# Patient Record
Sex: Female | Born: 1965
Health system: Southern US, Community
[De-identification: ages and names within clinical notes are randomized; demographics above are authoritative.]

## PROBLEM LIST (undated history)

## (undated) DIAGNOSIS — T753XXA Motion sickness, initial encounter: Secondary | ICD-10-CM

## (undated) DIAGNOSIS — I471 Supraventricular tachycardia, unspecified: Secondary | ICD-10-CM

## (undated) DIAGNOSIS — N72 Inflammatory disease of cervix uteri: Secondary | ICD-10-CM

## (undated) DIAGNOSIS — I10 Essential (primary) hypertension: Secondary | ICD-10-CM

## (undated) DIAGNOSIS — M3500 Sicca syndrome, unspecified: Secondary | ICD-10-CM

## (undated) DIAGNOSIS — M5430 Sciatica, unspecified side: Secondary | ICD-10-CM

## (undated) DIAGNOSIS — M329 Systemic lupus erythematosus, unspecified: Secondary | ICD-10-CM

## (undated) DIAGNOSIS — IMO0002 Reserved for concepts with insufficient information to code with codable children: Secondary | ICD-10-CM

## (undated) DIAGNOSIS — R87629 Unspecified abnormal cytological findings in specimens from vagina: Secondary | ICD-10-CM

## (undated) DIAGNOSIS — M5136 Other intervertebral disc degeneration, lumbar region: Secondary | ICD-10-CM

## (undated) DIAGNOSIS — J069 Acute upper respiratory infection, unspecified: Secondary | ICD-10-CM

## (undated) DIAGNOSIS — L858 Other specified epidermal thickening: Secondary | ICD-10-CM

## (undated) DIAGNOSIS — R6882 Decreased libido: Secondary | ICD-10-CM

## (undated) DIAGNOSIS — D649 Anemia, unspecified: Secondary | ICD-10-CM

## (undated) DIAGNOSIS — I7300 Raynaud's syndrome without gangrene: Secondary | ICD-10-CM

## (undated) DIAGNOSIS — M51369 Other intervertebral disc degeneration, lumbar region without mention of lumbar back pain or lower extremity pain: Secondary | ICD-10-CM

## (undated) HISTORY — DX: Reserved for concepts with insufficient information to code with codable children: IMO0002

## (undated) HISTORY — DX: Other specified epidermal thickening: L85.8

## (undated) HISTORY — DX: Inflammatory disease of cervix uteri: N72

## (undated) HISTORY — DX: Essential (primary) hypertension: I10

## (undated) HISTORY — PX: TONSILLECTOMY: SUR1361

## (undated) HISTORY — PX: OTHER SURGICAL HISTORY: SHX169

## (undated) HISTORY — DX: Sjogren syndrome, unspecified: M35.00

## (undated) HISTORY — DX: Unspecified abnormal cytological findings in specimens from vagina: R87.629

## (undated) HISTORY — PX: LEEP: SHX91

## (undated) HISTORY — PX: BAND HEMORRHOIDECTOMY: SHX1213

## (undated) HISTORY — DX: Systemic lupus erythematosus, unspecified: M32.9

## (undated) HISTORY — DX: Decreased libido: R68.82

---

## 2004-08-16 ENCOUNTER — Ambulatory Visit: Payer: Self-pay

## 2004-11-10 ENCOUNTER — Ambulatory Visit: Payer: Self-pay

## 2005-11-24 ENCOUNTER — Ambulatory Visit: Payer: Self-pay

## 2006-11-03 ENCOUNTER — Ambulatory Visit: Payer: Self-pay | Admitting: Rheumatology

## 2006-11-20 ENCOUNTER — Encounter: Payer: Self-pay | Admitting: Orthopaedic Surgery

## 2006-11-28 ENCOUNTER — Ambulatory Visit: Payer: Self-pay

## 2007-12-05 ENCOUNTER — Ambulatory Visit: Payer: Self-pay

## 2008-05-26 ENCOUNTER — Ambulatory Visit: Payer: Self-pay | Admitting: Internal Medicine

## 2008-05-26 ENCOUNTER — Ambulatory Visit: Payer: Self-pay

## 2008-06-03 ENCOUNTER — Ambulatory Visit: Payer: Self-pay | Admitting: Internal Medicine

## 2008-07-06 ENCOUNTER — Emergency Department: Payer: Self-pay | Admitting: Emergency Medicine

## 2008-11-01 ENCOUNTER — Emergency Department: Payer: Self-pay | Admitting: Unknown Physician Specialty

## 2008-11-04 ENCOUNTER — Emergency Department: Payer: Self-pay | Admitting: Emergency Medicine

## 2008-11-09 ENCOUNTER — Emergency Department: Payer: Self-pay | Admitting: Internal Medicine

## 2008-11-15 ENCOUNTER — Emergency Department: Payer: Self-pay | Admitting: Emergency Medicine

## 2008-11-22 ENCOUNTER — Emergency Department: Payer: Self-pay | Admitting: Internal Medicine

## 2008-12-08 ENCOUNTER — Ambulatory Visit: Payer: Self-pay | Admitting: Obstetrics and Gynecology

## 2009-09-10 ENCOUNTER — Ambulatory Visit: Payer: Self-pay | Admitting: General Practice

## 2009-12-21 ENCOUNTER — Ambulatory Visit: Payer: Self-pay

## 2010-04-29 ENCOUNTER — Ambulatory Visit: Payer: Self-pay | Admitting: General Practice

## 2010-07-22 ENCOUNTER — Ambulatory Visit: Payer: Self-pay | Admitting: Otolaryngology

## 2011-01-17 ENCOUNTER — Ambulatory Visit: Payer: Self-pay

## 2011-01-25 ENCOUNTER — Ambulatory Visit: Payer: Self-pay | Admitting: Internal Medicine

## 2011-11-17 ENCOUNTER — Ambulatory Visit: Payer: Self-pay | Admitting: Otolaryngology

## 2011-11-17 LAB — CREATININE, SERUM
Creatinine: 0.97 mg/dL (ref 0.60–1.30)
EGFR (African American): >60
EGFR (Non-African Amer.): >60

## 2011-11-17 LAB — HCG, QUANTITATIVE, PREGNANCY: Beta Hcg, Quant.: <1 m[IU]/mL — ABNORMAL LOW

## 2012-01-18 ENCOUNTER — Ambulatory Visit: Payer: Self-pay

## 2012-02-12 ENCOUNTER — Ambulatory Visit: Payer: Self-pay | Admitting: Unknown Physician Specialty

## 2012-03-08 ENCOUNTER — Ambulatory Visit: Payer: Self-pay | Admitting: Unknown Physician Specialty

## 2012-03-21 ENCOUNTER — Ambulatory Visit: Payer: Self-pay | Admitting: Otolaryngology

## 2012-03-25 LAB — PATHOLOGY REPORT

## 2013-01-20 ENCOUNTER — Ambulatory Visit: Payer: Self-pay | Admitting: Obstetrics and Gynecology

## 2013-05-30 ENCOUNTER — Ambulatory Visit: Payer: Self-pay | Admitting: Internal Medicine

## 2013-06-15 ENCOUNTER — Ambulatory Visit: Payer: Self-pay | Admitting: Internal Medicine

## 2013-07-25 ENCOUNTER — Ambulatory Visit: Payer: Self-pay | Admitting: Internal Medicine

## 2013-08-13 ENCOUNTER — Ambulatory Visit: Payer: Self-pay | Admitting: Internal Medicine

## 2013-09-12 ENCOUNTER — Ambulatory Visit: Payer: Self-pay | Admitting: Internal Medicine

## 2013-10-13 ENCOUNTER — Ambulatory Visit: Payer: Self-pay | Admitting: Internal Medicine

## 2013-11-12 ENCOUNTER — Ambulatory Visit: Payer: Self-pay | Admitting: Internal Medicine

## 2013-12-13 ENCOUNTER — Ambulatory Visit: Payer: Self-pay | Admitting: Internal Medicine

## 2014-01-08 LAB — HM PAP SMEAR: HM Pap smear: NEGATIVE

## 2014-01-13 ENCOUNTER — Ambulatory Visit: Payer: Self-pay | Admitting: Internal Medicine

## 2014-02-17 ENCOUNTER — Ambulatory Visit: Payer: Self-pay | Admitting: Obstetrics and Gynecology

## 2014-04-16 DIAGNOSIS — D863 Sarcoidosis of skin: Secondary | ICD-10-CM | POA: Insufficient documentation

## 2014-04-16 DIAGNOSIS — M545 Low back pain, unspecified: Secondary | ICD-10-CM | POA: Insufficient documentation

## 2014-06-15 DIAGNOSIS — M5136 Other intervertebral disc degeneration, lumbar region: Secondary | ICD-10-CM | POA: Insufficient documentation

## 2014-06-15 DIAGNOSIS — M5416 Radiculopathy, lumbar region: Secondary | ICD-10-CM | POA: Insufficient documentation

## 2014-06-15 DIAGNOSIS — M51369 Other intervertebral disc degeneration, lumbar region without mention of lumbar back pain or lower extremity pain: Secondary | ICD-10-CM | POA: Insufficient documentation

## 2014-09-01 NOTE — Op Note (Signed)
PATIENT NAME:  Natalie Wheeler, Edia R MR#:  [id number] DATE OF BIRTH:  Mar 20, 1966  DATE OF PROCEDURE:  03/21/2012  PREOPERATIVE DIAGNOSIS: Cervical lymphadenopathy.   POSTOPERATIVE DIAGNOSIS: Cervical lymphadenopathy.  PROCEDURE: Deep left cervical lymph node biopsies.  SURGEON: Zackery BarefootJ. Madison Kendle Erker, MD   DESCRIPTION OF PROCEDURE: The patient was placed in the supine on the Operating Room table. After general LMA anesthesia was induced, the patient was turned 90 degrees clockwise from Anesthesia. The face was turned to the right and placed on a shoulder roll. The previously marked incision was locally anesthetized, prepped and draped in the usual fashion. A #15 blade was used to make an incision through the marking, and meticulous dissection was carried down to the posterior aspect of the sternocleidomastoid muscle. Care was taken to preserve the spinal accessory nerve which was positively stimulated and identified. The lymph nodes were deep to the spinal accessory nerve. These were carefully teased out. There were three 1.5 cm lymph nodes and a smaller lymph node in fibrofatty tissue. One of the larger nodes was sent for immediate analysis. The pathologist called back and reported that it was adequate tissue; therefore, the rest of the nodes were sent as well and the wound was closed over a perforated TLS drain. The wound was dressed with bacitracin. The patient was returned to Anesthesia, allowed to emerge from anesthesia in the Operating Room, and taken to the recovery room in stable condition. There were no complications. Estimated blood loss was 15 mL. ____________________________ J. Gertie BaronMadison Jerusalem Brownstein, MD [id number] D: 03/21/2012 20:16:04 ET T: 03/22/2012 09:54:31 ET JOB#: [id number]  cc: Zackery BarefootJ. Madison Kerby Borner, MD, <Dictator> Wendee CoppJMADISON Jamani Bearce MD ELECTRONICALLY SIGNED 04/17/2012 7:30

## 2015-01-21 ENCOUNTER — Other Ambulatory Visit: Payer: Self-pay | Admitting: Obstetrics and Gynecology

## 2015-02-10 ENCOUNTER — Encounter: Payer: Self-pay | Admitting: *Deleted

## 2015-02-18 ENCOUNTER — Ambulatory Visit (INDEPENDENT_AMBULATORY_CARE_PROVIDER_SITE_OTHER): Payer: 59 | Admitting: Obstetrics and Gynecology

## 2015-02-18 ENCOUNTER — Other Ambulatory Visit: Payer: Self-pay | Admitting: Obstetrics and Gynecology

## 2015-02-18 ENCOUNTER — Encounter: Payer: Self-pay | Admitting: Obstetrics and Gynecology

## 2015-02-18 VITALS — BP 130/77 | HR 85 | Ht 65.0 in | Wt 147.6 lb

## 2015-02-18 DIAGNOSIS — Z01419 Encounter for gynecological examination (general) (routine) without abnormal findings: Secondary | ICD-10-CM | POA: Diagnosis not present

## 2015-02-18 NOTE — Patient Instructions (Signed)
  Place annual gynecologic exam patient instructions here.  Thank you for enrolling in MyChart. Please follow the instructions below to securely access your online medical record. MyChart allows you to send messages to your doctor, view your test results, manage appointments, and more.   How Do I Sign Up? 1. In your Internet browser, go to [employer] and enter [email or web address]. 2. Click on the Sign Up Now link in the Sign In box. You will see the New Member Sign Up page. 3. Enter your MyChart Access Code exactly as it appears below. You will not need to use this code after you've completed the sign-up process. If you do not sign up before the expiration date, you must request a new code.  MyChart Access Code: [email or web address] Expires: 04/19/2015  3:53 PM  4. Enter your Social Security Number ([id number]) and Date of Birth (mm/dd/yyyy) as indicated and click Submit. You will be taken to the next sign-up page. 5. Create a MyChart ID. This will be your MyChart login ID and cannot be changed, so think of one that is secure and easy to remember. 6. Create a MyChart password. You can change your password at any time. 7. Enter your Password Reset Question and Answer. This can be used at a later time if you forget your password.  8. Enter your e-mail address. You will receive e-mail notification when new information is available in MyChart. 9. Click Sign Up. You can now view your medical record.   Additional Information Remember, MyChart is NOT to be used for urgent needs. For medical emergencies, dial 911.

## 2015-02-18 NOTE — Progress Notes (Signed)
  Subjective:     Natalie Wheeler is a 49 y.o. female and is here for a comprehensive physical exam. The patient reports no problems.  Social History   Social History  . Marital Status: Single    Spouse Name: N/A  . Number of Children: N/A  . Years of Education: N/A   Occupational History  . Not on file.   Social History Main Topics  . Smoking status: Former Games developer  . Smokeless tobacco: Never Used  . Alcohol Use: Yes  . Drug Use: No  . Sexual Activity: Yes   Other Topics Concern  . Not on file   Social History Narrative   Health Maintenance  Topic Date Due  . HIV Screening  02/27/1980  . TETANUS/TDAP  02/27/1984  . INFLUENZA VACCINE  12/14/2014  . PAP SMEAR  01/08/2017    The following portions of the patient's history were reviewed and updated as appropriate: allergies, current medications, past family history, past medical history, past social history, past surgical history and problem list.  Review of Systems A comprehensive review of systems was negative.   Objective:    General appearance: alert, cooperative, appears stated age, no distress and pale Neck: no adenopathy, no carotid bruit, no JVD, supple, symmetrical, trachea midline and thyroid not enlarged, symmetric, no tenderness/mass/nodules Lungs: clear to auscultation bilaterally Breasts: normal appearance, no masses or tenderness Heart: regular rate and rhythm, S1, S2 normal, no murmur, click, rub or gallop Abdomen: soft, non-tender; bowel sounds normal; no masses,  no organomegaly Pelvic: cervix normal in appearance, external genitalia normal, no adnexal masses or tenderness, no cervical motion tenderness, rectovaginal septum normal, uterus normal size, shape, and consistency and vagina normal without discharge Extremities: extremities normal, atraumatic, no cyanosis or edema    Assessment:    Healthy female exam. Lupus,ocp user      Plan:  Pap obtained; h/o abnormal pap;  MMG ordered RTC 1 year.    See After Visit Summary for Counseling Recommendations

## 2015-02-22 LAB — CYTOLOGY - PAP

## 2015-02-24 ENCOUNTER — Encounter: Payer: Self-pay | Admitting: Obstetrics and Gynecology

## 2015-04-01 ENCOUNTER — Ambulatory Visit: Payer: Self-pay

## 2015-04-13 ENCOUNTER — Ambulatory Visit
Admission: RE | Admit: 2015-04-13 | Discharge: 2015-04-13 | Disposition: A | Discharge status: Home / Self Care | Payer: 59 | Source: Ambulatory Visit | Attending: Obstetrics and Gynecology | Admitting: Obstetrics and Gynecology

## 2015-04-13 DIAGNOSIS — Z01419 Encounter for gynecological examination (general) (routine) without abnormal findings: Secondary | ICD-10-CM

## 2015-04-13 DIAGNOSIS — Z1231 Encounter for screening mammogram for malignant neoplasm of breast: Secondary | ICD-10-CM | POA: Insufficient documentation

## 2015-05-19 DIAGNOSIS — M329 Systemic lupus erythematosus, unspecified: Secondary | ICD-10-CM | POA: Diagnosis not present

## 2015-05-19 DIAGNOSIS — Z79899 Other long term (current) drug therapy: Secondary | ICD-10-CM | POA: Diagnosis not present

## 2015-05-21 DIAGNOSIS — R040 Epistaxis: Secondary | ICD-10-CM | POA: Diagnosis not present

## 2015-05-21 DIAGNOSIS — H6063 Unspecified chronic otitis externa, bilateral: Secondary | ICD-10-CM | POA: Diagnosis not present

## 2015-05-21 DIAGNOSIS — J301 Allergic rhinitis due to pollen: Secondary | ICD-10-CM | POA: Diagnosis not present

## 2015-06-15 DIAGNOSIS — R0981 Nasal congestion: Secondary | ICD-10-CM | POA: Diagnosis not present

## 2015-06-15 DIAGNOSIS — R040 Epistaxis: Secondary | ICD-10-CM | POA: Diagnosis not present

## 2015-06-15 DIAGNOSIS — J301 Allergic rhinitis due to pollen: Secondary | ICD-10-CM | POA: Diagnosis not present

## 2015-10-22 DIAGNOSIS — M329 Systemic lupus erythematosus, unspecified: Secondary | ICD-10-CM | POA: Diagnosis not present

## 2015-10-22 DIAGNOSIS — E538 Deficiency of other specified B group vitamins: Secondary | ICD-10-CM | POA: Diagnosis not present

## 2015-10-22 DIAGNOSIS — I10 Essential (primary) hypertension: Secondary | ICD-10-CM | POA: Diagnosis not present

## 2015-10-22 DIAGNOSIS — M3500 Sicca syndrome, unspecified: Secondary | ICD-10-CM | POA: Diagnosis not present

## 2015-10-22 DIAGNOSIS — M5416 Radiculopathy, lumbar region: Secondary | ICD-10-CM | POA: Diagnosis not present

## 2015-10-29 DIAGNOSIS — E538 Deficiency of other specified B group vitamins: Secondary | ICD-10-CM | POA: Diagnosis not present

## 2015-10-29 DIAGNOSIS — I10 Essential (primary) hypertension: Secondary | ICD-10-CM | POA: Diagnosis not present

## 2015-10-29 DIAGNOSIS — M329 Systemic lupus erythematosus, unspecified: Secondary | ICD-10-CM | POA: Diagnosis not present

## 2015-10-29 DIAGNOSIS — D6489 Other specified anemias: Secondary | ICD-10-CM | POA: Diagnosis not present

## 2015-11-08 DIAGNOSIS — M1990 Unspecified osteoarthritis, unspecified site: Secondary | ICD-10-CM | POA: Diagnosis not present

## 2015-11-08 DIAGNOSIS — M79642 Pain in left hand: Secondary | ICD-10-CM | POA: Diagnosis not present

## 2015-11-08 DIAGNOSIS — M1812 Unilateral primary osteoarthritis of first carpometacarpal joint, left hand: Secondary | ICD-10-CM | POA: Diagnosis not present

## 2015-11-08 DIAGNOSIS — M3500 Sicca syndrome, unspecified: Secondary | ICD-10-CM | POA: Diagnosis not present

## 2015-11-22 DIAGNOSIS — M329 Systemic lupus erythematosus, unspecified: Secondary | ICD-10-CM | POA: Diagnosis not present

## 2015-11-22 DIAGNOSIS — Z79899 Other long term (current) drug therapy: Secondary | ICD-10-CM | POA: Diagnosis not present

## 2015-11-26 DIAGNOSIS — M329 Systemic lupus erythematosus, unspecified: Secondary | ICD-10-CM | POA: Diagnosis not present

## 2015-11-26 DIAGNOSIS — Z79899 Other long term (current) drug therapy: Secondary | ICD-10-CM | POA: Diagnosis not present

## 2015-12-22 DIAGNOSIS — K1122 Acute recurrent sialoadenitis: Secondary | ICD-10-CM | POA: Diagnosis not present

## 2015-12-22 DIAGNOSIS — J0100 Acute maxillary sinusitis, unspecified: Secondary | ICD-10-CM | POA: Diagnosis not present

## 2015-12-30 ENCOUNTER — Other Ambulatory Visit: Payer: Self-pay | Admitting: Otolaryngology

## 2015-12-30 DIAGNOSIS — K1122 Acute recurrent sialoadenitis: Secondary | ICD-10-CM

## 2016-01-06 ENCOUNTER — Ambulatory Visit: Payer: 59

## 2016-01-12 ENCOUNTER — Ambulatory Visit: Payer: 59

## 2016-02-01 ENCOUNTER — Other Ambulatory Visit: Payer: Self-pay | Admitting: Obstetrics and Gynecology

## 2016-02-23 ENCOUNTER — Encounter: Payer: Self-pay | Admitting: Obstetrics and Gynecology

## 2016-02-23 ENCOUNTER — Ambulatory Visit (INDEPENDENT_AMBULATORY_CARE_PROVIDER_SITE_OTHER): Payer: 59 | Admitting: Obstetrics and Gynecology

## 2016-02-23 VITALS — BP 147/86 | HR 69 | Ht 65.0 in | Wt 147.4 lb

## 2016-02-23 DIAGNOSIS — I10 Essential (primary) hypertension: Secondary | ICD-10-CM | POA: Insufficient documentation

## 2016-02-23 DIAGNOSIS — M329 Systemic lupus erythematosus, unspecified: Secondary | ICD-10-CM | POA: Insufficient documentation

## 2016-02-23 DIAGNOSIS — M3500 Sicca syndrome, unspecified: Secondary | ICD-10-CM

## 2016-02-23 DIAGNOSIS — E538 Deficiency of other specified B group vitamins: Secondary | ICD-10-CM | POA: Insufficient documentation

## 2016-02-23 DIAGNOSIS — Z01419 Encounter for gynecological examination (general) (routine) without abnormal findings: Secondary | ICD-10-CM | POA: Diagnosis not present

## 2016-02-23 MED ORDER — ALPRAZOLAM 0.5 MG PO TABS: 0.5000 mg | ORAL_TABLET | Freq: Three times a day (TID) | ORAL | 2 refills | Status: DC | PRN

## 2016-02-23 NOTE — Progress Notes (Signed)
Subjective:   Natalie Wheeler is a 50 y.o. G0P0 Caucasian female here for a routine well-woman exam.  No LMP recorded. Patient is not currently having periods (Reason: Oral contraceptives).    Current complaints: anxiety and not happy with Buspar, lump under left anticubital- not painful, PCP: Marikay Alar       doesn't desire labs  Social History: Sexual: heterosexual Marital Status: divorced Living situation: alone Occupation: unknown occupation Tobacco/alcohol: no tobacco use Illicit drugs: no history of illicit drug use  The following portions of the patient's history were reviewed and updated as appropriate: allergies, current medications, past family history, past medical history, past social history, past surgical history and problem list.  Past Medical History Past Medical History:  Diagnosis Date  . Cervicitis   . Decreased libido   . Dyspareunia   . Hypertension   . Lupus   . Sjogren's disease (HCC)   . Vaginal Pap smear, abnormal     Past Surgical History Past Surgical History:  Procedure Laterality Date  . BAND HEMORRHOIDECTOMY    . LEEP    . TONSILLECTOMY      Gynecologic History G0P0  No LMP recorded. Patient is not currently having periods (Reason: Oral contraceptives). Contraception: abstinence and oral progesterone-only contraceptive Last Pap: 2016. Results were: normal Last mammogram: 2016. Results were: normal  Obstetric History OB History  Gravida Para Term Preterm AB Living  0            SAB TAB Ectopic Multiple Live Births                   Current Medications Current Outpatient Prescriptions on File Prior to Visit  Medication Sig Dispense Refill  . Azelastine-Fluticasone (DYMISTA) 137-50 MCG/ACT SUSP Place into the nose.    . baclofen (LIORESAL) 20 MG tablet Take 20 mg by mouth as needed for muscle spasms.    . cetirizine (ZYRTEC) 10 MG tablet Take 10 mg by mouth daily.    . diclofenac (VOLTAREN) 50 MG EC tablet Take 50 mg by mouth 2 (two)  times daily.    . eszopiclone (LUNESTA) 2 MG TABS tablet Take 2 mg by mouth at bedtime as needed for sleep. Take immediately before bedtime    . hydroxychloroquine (PLAQUENIL) 200 MG tablet Take by mouth daily.    . metoprolol succinate (TOPROL-XL) 25 MG 24 hr tablet Take 25 mg by mouth daily.    . norethindrone (MICRONOR,CAMILA,ERRIN) 0.35 MG tablet TAKE 1 TABLET BY MOUTH ONCE DAILY 84 tablet 5  . cevimeline (EVOXAC) 30 MG capsule Take 30 mg by mouth daily.     No current facility-administered medications on file prior to visit.     Review of Systems Patient denies any headaches, blurred vision, shortness of breath, chest pain, abdominal pain, problems with bowel movements, urination, or intercourse.  Objective:  BP (!) 147/86   Pulse 69   Ht 5\' 5"  (1.651 m)   Wt 147 lb 6.4 oz (66.9 kg)   BMI 24.53 kg/m  Physical Exam  General:  Well developed, well nourished, no acute distress. She is alert and oriented x3. Skin:  Warm and dry Neck:  Midline trachea, no thyromegaly or nodules Cardiovascular: Regular rate and rhythm, no murmur heard Lungs:  Effort normal, all lung fields clear to auscultation bilaterally Breasts:  No dominant palpable mass, retraction, or nipple discharge Abdomen:  Soft, non tender, no hepatosplenomegaly or masses Pelvic:  External genitalia is normal in appearance.  The vagina is normal in  appearance. The cervix is bulbous, no CMT.  Thin prep pap is not done. Uterus is felt to be normal size, shape, and contour.  No adnexal masses or tenderness noted. Extremities:  No swelling or varicosities noted Psych:  She has a normal mood and affect  Assessment:   Healthy well-woman exam Anxiety Secondary amenorrhea  Plan:  Labs obtained, to stop OCPs for menopause F/U 1 year for AE, or sooner if needed Mammogram ordered  Shaydon Lease Suzan NailerN Deundre Thong, CNM

## 2016-02-24 LAB — COMPREHENSIVE METABOLIC PANEL
ALT: 17 IU/L (ref 0–32)
AST: 23 IU/L (ref 0–40)
Albumin/Globulin Ratio: 1.8 (ref 1.2–2.2)
Albumin: 4.6 g/dL (ref 3.5–5.5)
Alkaline Phosphatase: 65 IU/L (ref 39–117)
BUN/Creatinine Ratio: 22 (ref 9–23)
BUN: 18 mg/dL (ref 6–24)
Bilirubin Total: 0.5 mg/dL (ref 0.0–1.2)
CO2: 22 mmol/L (ref 18–29)
Calcium: 9.2 mg/dL (ref 8.7–10.2)
Chloride: 97 mmol/L (ref 96–106)
Creatinine, Ser: 0.83 mg/dL (ref 0.57–1.00)
GFR calc Af Amer: 95 mL/min/{1.73_m2} (ref >59)
GFR calc non Af Amer: 82 mL/min/{1.73_m2} (ref >59)
Globulin, Total: 2.6 g/dL (ref 1.5–4.5)
Glucose: 77 mg/dL (ref 65–99)
Potassium: 3.8 mmol/L (ref 3.5–5.2)
Sodium: 140 mmol/L (ref 134–144)
Total Protein: 7.2 g/dL (ref 6.0–8.5)

## 2016-02-24 LAB — B12 AND FOLATE PANEL
Folate: >20 ng/mL (ref >3.0)
Vitamin B-12: 862 pg/mL (ref 211–946)

## 2016-02-24 LAB — THYROID PANEL WITH TSH
Free Thyroxine Index: 2.1 (ref 1.2–4.9)
T3 Uptake Ratio: 28 % (ref 24–39)
T4, Total: 7.5 ug/dL (ref 4.5–12.0)
TSH: 2.5 u[IU]/mL (ref 0.450–4.500)

## 2016-02-24 LAB — VITAMIN D 25 HYDROXY (VIT D DEFICIENCY, FRACTURES): Vit D, 25-Hydroxy: 56 ng/mL (ref 30.0–100.0)

## 2016-02-29 ENCOUNTER — Other Ambulatory Visit: Payer: Self-pay | Admitting: Physical Medicine and Rehabilitation

## 2016-02-29 DIAGNOSIS — M5136 Other intervertebral disc degeneration, lumbar region: Secondary | ICD-10-CM | POA: Diagnosis not present

## 2016-02-29 DIAGNOSIS — M5416 Radiculopathy, lumbar region: Secondary | ICD-10-CM | POA: Diagnosis not present

## 2016-03-11 ENCOUNTER — Ambulatory Visit: Payer: 59

## 2016-03-14 ENCOUNTER — Ambulatory Visit
Admission: RE | Admit: 2016-03-14 | Discharge: 2016-03-14 | Disposition: A | Discharge status: Home / Self Care | Payer: 59 | Source: Ambulatory Visit | Attending: Physical Medicine and Rehabilitation | Admitting: Physical Medicine and Rehabilitation

## 2016-03-14 DIAGNOSIS — M5416 Radiculopathy, lumbar region: Secondary | ICD-10-CM | POA: Diagnosis not present

## 2016-03-14 DIAGNOSIS — M545 Low back pain: Secondary | ICD-10-CM | POA: Diagnosis not present

## 2016-03-29 DIAGNOSIS — M48062 Spinal stenosis, lumbar region with neurogenic claudication: Secondary | ICD-10-CM | POA: Diagnosis not present

## 2016-03-29 DIAGNOSIS — M5136 Other intervertebral disc degeneration, lumbar region: Secondary | ICD-10-CM | POA: Diagnosis not present

## 2016-03-29 DIAGNOSIS — M5416 Radiculopathy, lumbar region: Secondary | ICD-10-CM | POA: Diagnosis not present

## 2016-04-17 ENCOUNTER — Ambulatory Visit
Admission: RE | Admit: 2016-04-17 | Discharge: 2016-04-17 | Disposition: A | Discharge status: Home / Self Care | Payer: 59 | Source: Ambulatory Visit | Attending: Obstetrics and Gynecology | Admitting: Obstetrics and Gynecology

## 2016-04-17 DIAGNOSIS — Z1231 Encounter for screening mammogram for malignant neoplasm of breast: Secondary | ICD-10-CM | POA: Diagnosis not present

## 2016-04-17 DIAGNOSIS — M1812 Unilateral primary osteoarthritis of first carpometacarpal joint, left hand: Secondary | ICD-10-CM | POA: Diagnosis not present

## 2016-04-17 DIAGNOSIS — Z01419 Encounter for gynecological examination (general) (routine) without abnormal findings: Secondary | ICD-10-CM

## 2016-04-17 DIAGNOSIS — M65331 Trigger finger, right middle finger: Secondary | ICD-10-CM | POA: Diagnosis not present

## 2016-04-28 DIAGNOSIS — I10 Essential (primary) hypertension: Secondary | ICD-10-CM | POA: Diagnosis not present

## 2016-04-28 DIAGNOSIS — E538 Deficiency of other specified B group vitamins: Secondary | ICD-10-CM | POA: Diagnosis not present

## 2016-05-04 DIAGNOSIS — M5416 Radiculopathy, lumbar region: Secondary | ICD-10-CM | POA: Diagnosis not present

## 2016-05-04 DIAGNOSIS — M5136 Other intervertebral disc degeneration, lumbar region: Secondary | ICD-10-CM | POA: Diagnosis not present

## 2016-05-05 DIAGNOSIS — M5416 Radiculopathy, lumbar region: Secondary | ICD-10-CM | POA: Diagnosis not present

## 2016-05-05 DIAGNOSIS — Z0001 Encounter for general adult medical examination with abnormal findings: Secondary | ICD-10-CM | POA: Diagnosis not present

## 2016-05-05 DIAGNOSIS — M5136 Other intervertebral disc degeneration, lumbar region: Secondary | ICD-10-CM | POA: Diagnosis not present

## 2016-05-05 DIAGNOSIS — I10 Essential (primary) hypertension: Secondary | ICD-10-CM | POA: Diagnosis not present

## 2016-05-22 DIAGNOSIS — M329 Systemic lupus erythematosus, unspecified: Secondary | ICD-10-CM | POA: Diagnosis not present

## 2016-05-22 DIAGNOSIS — Z79899 Other long term (current) drug therapy: Secondary | ICD-10-CM | POA: Diagnosis not present

## 2016-05-24 DIAGNOSIS — M329 Systemic lupus erythematosus, unspecified: Secondary | ICD-10-CM | POA: Diagnosis not present

## 2016-05-24 DIAGNOSIS — Z79899 Other long term (current) drug therapy: Secondary | ICD-10-CM | POA: Diagnosis not present

## 2016-05-25 DIAGNOSIS — M5416 Radiculopathy, lumbar region: Secondary | ICD-10-CM | POA: Diagnosis not present

## 2016-05-25 DIAGNOSIS — M5136 Other intervertebral disc degeneration, lumbar region: Secondary | ICD-10-CM | POA: Diagnosis not present

## 2016-07-26 DIAGNOSIS — M48062 Spinal stenosis, lumbar region with neurogenic claudication: Secondary | ICD-10-CM | POA: Diagnosis not present

## 2016-07-26 DIAGNOSIS — M5136 Other intervertebral disc degeneration, lumbar region: Secondary | ICD-10-CM | POA: Diagnosis not present

## 2016-07-26 DIAGNOSIS — M5416 Radiculopathy, lumbar region: Secondary | ICD-10-CM | POA: Diagnosis not present

## 2016-10-24 DIAGNOSIS — I10 Essential (primary) hypertension: Secondary | ICD-10-CM | POA: Diagnosis not present

## 2016-10-24 DIAGNOSIS — E538 Deficiency of other specified B group vitamins: Secondary | ICD-10-CM | POA: Diagnosis not present

## 2016-10-31 DIAGNOSIS — D638 Anemia in other chronic diseases classified elsewhere: Secondary | ICD-10-CM | POA: Diagnosis not present

## 2016-10-31 DIAGNOSIS — M5136 Other intervertebral disc degeneration, lumbar region: Secondary | ICD-10-CM | POA: Diagnosis not present

## 2016-10-31 DIAGNOSIS — E538 Deficiency of other specified B group vitamins: Secondary | ICD-10-CM | POA: Diagnosis not present

## 2016-10-31 DIAGNOSIS — I10 Essential (primary) hypertension: Secondary | ICD-10-CM | POA: Diagnosis not present

## 2016-11-22 DIAGNOSIS — Z79899 Other long term (current) drug therapy: Secondary | ICD-10-CM | POA: Diagnosis not present

## 2016-11-22 DIAGNOSIS — M329 Systemic lupus erythematosus, unspecified: Secondary | ICD-10-CM | POA: Diagnosis not present

## 2016-11-29 DIAGNOSIS — M329 Systemic lupus erythematosus, unspecified: Secondary | ICD-10-CM | POA: Diagnosis not present

## 2016-11-29 DIAGNOSIS — Z79899 Other long term (current) drug therapy: Secondary | ICD-10-CM | POA: Diagnosis not present

## 2016-12-08 DIAGNOSIS — R42 Dizziness and giddiness: Secondary | ICD-10-CM | POA: Diagnosis not present

## 2016-12-08 DIAGNOSIS — H93299 Other abnormal auditory perceptions, unspecified ear: Secondary | ICD-10-CM | POA: Diagnosis not present

## 2016-12-11 ENCOUNTER — Other Ambulatory Visit: Payer: Self-pay | Admitting: Otolaryngology

## 2016-12-11 DIAGNOSIS — R42 Dizziness and giddiness: Secondary | ICD-10-CM

## 2016-12-15 ENCOUNTER — Ambulatory Visit
Admission: RE | Admit: 2016-12-15 | Discharge: 2016-12-15 | Disposition: A | Discharge status: Home / Self Care | Payer: 59 | Source: Ambulatory Visit | Attending: Otolaryngology | Admitting: Otolaryngology

## 2016-12-15 DIAGNOSIS — R42 Dizziness and giddiness: Secondary | ICD-10-CM | POA: Diagnosis not present

## 2016-12-15 DIAGNOSIS — I6782 Cerebral ischemia: Secondary | ICD-10-CM | POA: Diagnosis not present

## 2016-12-15 MED ORDER — GADOBENATE DIMEGLUMINE 529 MG/ML IV SOLN
10.0000 mL | Freq: Once | INTRAVENOUS | Status: AC | PRN
  Administered 2016-12-15: 10 mL via INTRAVENOUS

## 2017-01-04 DIAGNOSIS — M48062 Spinal stenosis, lumbar region with neurogenic claudication: Secondary | ICD-10-CM | POA: Diagnosis not present

## 2017-01-04 DIAGNOSIS — M5416 Radiculopathy, lumbar region: Secondary | ICD-10-CM | POA: Diagnosis not present

## 2017-01-04 DIAGNOSIS — M5136 Other intervertebral disc degeneration, lumbar region: Secondary | ICD-10-CM | POA: Diagnosis not present

## 2017-01-09 DIAGNOSIS — J342 Deviated nasal septum: Secondary | ICD-10-CM | POA: Diagnosis not present

## 2017-02-12 DIAGNOSIS — J069 Acute upper respiratory infection, unspecified: Secondary | ICD-10-CM | POA: Diagnosis not present

## 2017-02-12 DIAGNOSIS — M329 Systemic lupus erythematosus, unspecified: Secondary | ICD-10-CM | POA: Diagnosis not present

## 2017-02-12 DIAGNOSIS — M3500 Sicca syndrome, unspecified: Secondary | ICD-10-CM | POA: Diagnosis not present

## 2017-02-14 ENCOUNTER — Encounter: Payer: Self-pay | Admitting: *Deleted

## 2017-02-14 DIAGNOSIS — I7300 Raynaud's syndrome without gangrene: Secondary | ICD-10-CM | POA: Insufficient documentation

## 2017-02-20 NOTE — Discharge Instructions (Signed)
[facility] °[facility] °ENDOSCOPIC SINUS SURGERY °[facility] ° °What is Functional Endoscopic Sinus Surgery? ° The Surgery involves making the natural openings of the sinuses larger by removing the bony partitions that separate the sinuses from the nasal cavity.  The natural sinus lining is preserved as much as possible to allow the sinuses to resume normal function after the surgery.  In some patients nasal polyps (excessively swollen lining of the sinuses) may be removed to relieve obstruction of the sinus openings.  The surgery is performed through the nose using lighted scopes, which eliminates the need for incisions on the face.  A septoplasty is a different procedure which is sometimes performed with sinus surgery.  It involves straightening the boy partition that separates the two sides of your nose.  A crooked or deviated septum may need repair if is obstructing the sinuses or nasal airflow.  Turbinate reduction is also often performed during sinus surgery.  The turbinates are bony proturberances from the side walls of the nose which swell and can obstruct the nose in patients with sinus and allergy problems.  Their size can be surgically reduced to help relieve nasal obstruction. ° °What Can Sinus Surgery Do For Me? ° Sinus surgery can reduce the frequency of sinus infections requiring antibiotic treatment.  This can provide improvement in nasal congestion, post-nasal drainage, facial pressure and nasal obstruction.  Surgery will NOT prevent you from ever having an infection again, so it usually only for patients who get infections 4 or more times yearly requiring antibiotics, or for infections that do not clear with antibiotics.  It will not cure nasal allergies, so patients with allergies may still require medication to treat their allergies after surgery. Surgery may improve headaches related to sinusitis, however, some people will continue to  require medication to control sinus headaches related to allergies.  Surgery will do nothing for other forms of headache (migraine, tension or cluster). ° °What Are the Risks of Endoscopic Sinus Surgery? ° Current techniques allow surgery to be performed safely with little risk, however, there are rare complications that patients should be aware of.  Because the sinuses are located around the eyes, there is risk of eye injury, including blindness, though again, this would be quite rare. This is usually a result of bleeding behind the eye during surgery, which puts the vision oat risk, though there are treatments to protect the vision and prevent permanent disrupted by surgery causing a leak of the spinal fluid that surrounds the brain.  More serious complications would include bleeding inside the brain cavity or damage to the brain.  Again, all of these complications are uncommon, and spinal fluid leaks can be safely managed surgically if they occur.  The most common complication of sinus surgery is bleeding from the nose, which may require packing or cauterization of the nose.  Continued sinus have polyps may experience recurrence of the polyps requiring revision surgery.  Alterations of sense of smell or injury to the tear ducts are also rare complications.  ° °What is the Surgery Like, and what is the Recovery? ° The Surgery usually takes a couple of hours to perform, and is usually performed under a general anesthetic (completely asleep).  Patients are usually discharged home after a couple of hours.  Sometimes during surgery it is necessary to pack the nose to control bleeding, and the packing is left in place for 24 - 48 hours, and removed by your surgeon.    If a septoplasty was performed during the procedure, there is often a splint placed which must be removed after 5-7 days.   °Discomfort: Pain is usually mild to moderate, and can be controlled by prescription pain medication or acetaminophen (Tylenol).   Aspirin, Ibuprofen (Advil, Motrin), or Naprosyn (Aleve) should be avoided, as they can cause increased bleeding.  Most patients feel sinus pressure like they have a bad head cold for several days.  Sleeping with your head elevated can help reduce swelling and facial pressure, as can ice packs over the face.  A humidifier may be helpful to keep the mucous and blood from drying in the nose.  ° °Diet: There are no specific diet restrictions, however, you should generally start with clear liquids and a light diet of bland foods because the anesthetic can cause some nausea.  Advance your diet depending on how your stomach feels.  Taking your pain medication with food will often help reduce stomach upset which pain medications can cause. ° °Nasal Saline Irrigation: It is important to remove blood clots and dried mucous from the nose as it is healing.  This is done by having you irrigate the nose at least 3 - 4 times daily with a salt water solution.  We recommend using NeilMed Sinus Rinse (available at the drug store).  Fill the squeeze bottle with the solution, bend over a sink, and insert the tip of the squeeze bottle into the nose ½ of an inch.  Point the tip of the squeeze bottle towards the inside corner of the eye on the same side your irrigating.  Squeeze the bottle and gently irrigate the nose.  If you bend forward as you do this, most of the fluid will flow back out of the nose, instead of down your throat.   The solution should be warm, near body temperature, when you irrigate.   Each time you irrigate, you should use a full squeeze bottle.  ° °Note that if you are instructed to use Nasal Steroid Sprays at any time after your surgery, irrigate with saline BEFORE using the steroid spray, so you do not wash it all out of the nose. °Another product, Nasal Saline Gel (such as AYR Nasal Saline Gel) can be applied in each nostril 3 - 4 times daily to moisture the nose and reduce scabbing or crusting. ° °Bleeding:   Bloody drainage from the nose can be expected for several days, and patients are instructed to irrigate their nose frequently with salt water to help remove mucous and blood clots.  The drainage may be dark red or brown, though some fresh blood may be seen intermittently, especially after irrigation.  Do not blow you nose, as bleeding may occur. If you must sneeze, keep your mouth open to allow air to escape through your mouth. ° °If heavy bleeding occurs: Irrigate the nose with saline to rinse out clots, then spray the nose 3 - 4 times with Afrin Nasal Decongestant Spray.  The spray will constrict the blood vessels to slow bleeding.  Pinch the lower half of your nose shut to apply pressure, and lay down with your head elevated.  Ice packs over the nose may help as well. If bleeding persists despite these measures, you should notify your doctor.  Do not use the Afrin routinely to control nasal congestion after surgery, as it can result in worsening congestion and may affect healing.  ° ° ° °Activity: Return to work varies among patients. Most patients will be   out of work at least 5 - 7 days to recover.  Patient may return to work after they are off of narcotic pain medication, and feeling well enough to perform the functions of their job.  Patients must avoid heavy lifting (over 10 pounds) or strenuous physical for 2 weeks after surgery, so your employer may need to assign you to light duty, or keep you out of work longer if light duty is not possible.  NOTE: you should not drive, operate dangerous machinery, do any mentally demanding tasks or make any important legal or financial decisions while on narcotic pain medication and recovering from the general anesthetic.  °  °Call Your Doctor Immediately if You Have Any of the Following: °1. Bleeding that you cannot control with the above measures °2. Loss of vision, double vision, bulging of the eye or black eyes. °3. Fever over 101 degrees °4. Neck stiffness with  severe headache, fever, nausea and change in mental state. °You are always encourage to call anytime with concerns, however, please call with requests for pain medication refills during office hours. ° °Office Endoscopy: During follow-up visits your doctor will remove any packing or splints that may have been placed and evaluate and clean your sinuses endoscopically.  Topical anesthetic will be used to make this as comfortable as possible, though you may want to take your pain medication prior to the visit.  How often this will need to be done varies from patient to patient.  After complete recovery from the surgery, you may need follow-up endoscopy from time to time, particularly if there is concern of recurrent infection or nasal polyps. ° ° °General Anesthesia, Adult, Care After °These instructions provide you with information about caring for yourself after your procedure. Your health care provider may also give you more specific instructions. Your treatment has been planned according to current medical practices, but problems sometimes occur. Call your health care provider if you have any problems or questions after your procedure. °What can I expect after the procedure? °After the procedure, it is common to have: °· Vomiting. °· A sore throat. °· Mental slowness. ° °It is common to feel: °· Nauseous. °· Cold or shivery. °· Sleepy. °· Tired. °· Sore or achy, even in parts of your body where you did not have surgery. ° °Follow these instructions at home: °For at least 24 hours after the procedure: °· Do not: °? Participate in activities where you could fall or become injured. °? Drive. °? Use heavy machinery. °? Drink alcohol. °? Take sleeping pills or medicines that cause drowsiness. °? Make important decisions or sign legal documents. °? Take care of children on your own. °· Rest. °Eating and drinking °· If you vomit, drink water, juice, or soup when you can drink without vomiting. °· Drink enough fluid to  keep your urine clear or pale yellow. °· Make sure you have little or no nausea before eating solid foods. °· Follow the diet recommended by your health care provider. °General instructions °· Have a responsible adult stay with you until you are awake and alert. °· Return to your normal activities as told by your health care provider. Ask your health care provider what activities are safe for you. °· Take over-the-counter and prescription medicines only as told by your health care provider. °· If you smoke, do not smoke without supervision. °· Keep all follow-up visits as told by your health care provider. This is important. °Contact a health care provider if: °· You   continue to have nausea or vomiting at home, and medicines are not helpful. °· You cannot drink fluids or start eating again. °· You cannot urinate after 8-12 hours. °· You develop a skin rash. °· You have fever. °· You have increasing redness at the site of your procedure. °Get help right away if: °· You have difficulty breathing. °· You have chest pain. °· You have unexpected bleeding. °· You feel that you are having a life-threatening or urgent problem. °This information is not intended to replace advice given to you by your health care provider. Make sure you discuss any questions you have with your health care provider. °Document Released: 08/07/2000 Document Revised: 10/04/2015 Document Reviewed: 04/15/2015 °[employer] Interactive Patient Education © 2018 [employer] ° °

## 2017-02-21 ENCOUNTER — Ambulatory Visit: Payer: 59 | Admitting: Anesthesiology

## 2017-02-21 ENCOUNTER — Encounter
Admission: RE | Disposition: A | Discharge status: Home / Self Care | Payer: Self-pay | Source: Ambulatory Visit | Attending: Otolaryngology

## 2017-02-21 ENCOUNTER — Ambulatory Visit
Admission: RE | Admit: 2017-02-21 | Discharge: 2017-02-21 | Disposition: A | Discharge status: Home / Self Care | Payer: 59 | Source: Ambulatory Visit | Attending: Otolaryngology | Admitting: Otolaryngology

## 2017-02-21 DIAGNOSIS — Z79899 Other long term (current) drug therapy: Secondary | ICD-10-CM | POA: Insufficient documentation

## 2017-02-21 DIAGNOSIS — Z881 Allergy status to other antibiotic agents status: Secondary | ICD-10-CM | POA: Insufficient documentation

## 2017-02-21 DIAGNOSIS — J343 Hypertrophy of nasal turbinates: Secondary | ICD-10-CM | POA: Insufficient documentation

## 2017-02-21 DIAGNOSIS — Z888 Allergy status to other drugs, medicaments and biological substances status: Secondary | ICD-10-CM | POA: Diagnosis not present

## 2017-02-21 DIAGNOSIS — J342 Deviated nasal septum: Secondary | ICD-10-CM | POA: Diagnosis not present

## 2017-02-21 HISTORY — DX: Motion sickness, initial encounter: T75.3XXA

## 2017-02-21 HISTORY — DX: Anemia, unspecified: D64.9

## 2017-02-21 HISTORY — DX: Other intervertebral disc degeneration, lumbar region without mention of lumbar back pain or lower extremity pain: M51.369

## 2017-02-21 HISTORY — DX: Supraventricular tachycardia, unspecified: I47.10

## 2017-02-21 HISTORY — PX: NASAL SEPTOPLASTY W/ TURBINOPLASTY: SHX2070

## 2017-02-21 HISTORY — DX: Acute upper respiratory infection, unspecified: J06.9

## 2017-02-21 HISTORY — DX: Other intervertebral disc degeneration, lumbar region: M51.36

## 2017-02-21 HISTORY — DX: Supraventricular tachycardia: I47.1

## 2017-02-21 HISTORY — DX: Sciatica, unspecified side: M54.30

## 2017-02-21 HISTORY — DX: Raynaud's syndrome without gangrene: I73.00

## 2017-02-21 SURGERY — SEPTOPLASTY, NOSE, WITH NASAL TURBINATE REDUCTION
Anesthesia: General | Laterality: Bilateral | Wound class: Clean Contaminated

## 2017-02-21 MED ORDER — LIDOCAINE-EPINEPHRINE 1 %-1:100000 IJ SOLN
INTRAMUSCULAR | Status: DC | PRN
  Administered 2017-02-21: 8 mL

## 2017-02-21 MED ORDER — OXYCODONE HCL 5 MG/5ML PO SOLN: 5.0000 mg | Freq: Once | ORAL | Status: DC | PRN

## 2017-02-21 MED ORDER — EPHEDRINE SULFATE 50 MG/ML IJ SOLN
INTRAMUSCULAR | Status: DC | PRN
  Administered 2017-02-21: 10 mg via INTRAVENOUS

## 2017-02-21 MED ORDER — LACTATED RINGERS IV SOLN
1000.0000 mL | INTRAVENOUS | Status: DC
  Administered 2017-02-21: 1000 mL via INTRAVENOUS

## 2017-02-21 MED ORDER — ACETAMINOPHEN 10 MG/ML IV SOLN
1000.0000 mg | Freq: Once | INTRAVENOUS | Status: AC
  Administered 2017-02-21: 1000 mg via INTRAVENOUS

## 2017-02-21 MED ORDER — MIDAZOLAM HCL 5 MG/5ML IJ SOLN
INTRAMUSCULAR | Status: DC | PRN
  Administered 2017-02-21: 2 mg via INTRAVENOUS

## 2017-02-21 MED ORDER — FENTANYL CITRATE (PF) 100 MCG/2ML IJ SOLN: 25.0000 ug | INTRAMUSCULAR | Status: DC | PRN

## 2017-02-21 MED ORDER — SUCCINYLCHOLINE CHLORIDE 20 MG/ML IJ SOLN
INTRAMUSCULAR | Status: DC | PRN
  Administered 2017-02-21: 80 mg via INTRAVENOUS

## 2017-02-21 MED ORDER — SCOPOLAMINE 1 MG/3DAYS TD PT72
1.0000 | MEDICATED_PATCH | TRANSDERMAL | Status: DC
  Administered 2017-02-21: 1.5 mg via TRANSDERMAL

## 2017-02-21 MED ORDER — DEXAMETHASONE SODIUM PHOSPHATE 4 MG/ML IJ SOLN
INTRAMUSCULAR | Status: DC | PRN
  Administered 2017-02-21: 8 mg via INTRAVENOUS

## 2017-02-21 MED ORDER — OXYMETAZOLINE HCL 0.05 % NA SOLN
NASAL | Status: DC | PRN
  Administered 2017-02-21: 1

## 2017-02-21 MED ORDER — FENTANYL CITRATE (PF) 100 MCG/2ML IJ SOLN
INTRAMUSCULAR | Status: DC | PRN
  Administered 2017-02-21: 100 ug via INTRAVENOUS

## 2017-02-21 MED ORDER — ONDANSETRON HCL 4 MG/2ML IJ SOLN
INTRAMUSCULAR | Status: DC | PRN
  Administered 2017-02-21: 4 mg via INTRAVENOUS

## 2017-02-21 MED ORDER — PROPOFOL 10 MG/ML IV BOLUS
INTRAVENOUS | Status: DC | PRN
  Administered 2017-02-21: 120 mg via INTRAVENOUS

## 2017-02-21 MED ORDER — LIDOCAINE HCL (CARDIAC) 20 MG/ML IV SOLN
INTRAVENOUS | Status: DC | PRN
  Administered 2017-02-21: 50 mg via INTRAVENOUS

## 2017-02-21 MED ORDER — OXYCODONE HCL 5 MG PO TABS: 5.0000 mg | ORAL_TABLET | Freq: Once | ORAL | Status: DC | PRN

## 2017-02-21 MED ORDER — BACITRACIN 500 UNIT/GM EX OINT
TOPICAL_OINTMENT | CUTANEOUS | Status: DC | PRN
  Administered 2017-02-21: 1 via TOPICAL

## 2017-02-21 SURGICAL SUPPLY — 23 items
CANISTER SUCT 1200ML W/VALVE (MISCELLANEOUS) ×2 IMPLANT
COAG SUCT 10F 3.5MM HAND CTRL (MISCELLANEOUS) ×2 IMPLANT
DRAPE HEAD BAR (DRAPES) ×2 IMPLANT
DRESSING NASL FOAM PST OP SINU (MISCELLANEOUS) ×2 IMPLANT
DRSG NASAL FOAM POST OP SINU (MISCELLANEOUS) ×4
GLOVE BIO SURGEON STRL SZ7.5 (GLOVE) ×4 IMPLANT
KIT ROOM TURNOVER OR (KITS) ×2 IMPLANT
NEEDLE HYPO 25GX1X1/2 BEV (NEEDLE) ×2 IMPLANT
PACK DRAPE NASAL/ENT (PACKS) ×2 IMPLANT
PAD GROUND ADULT SPLIT (MISCELLANEOUS) ×2 IMPLANT
PATTIES SURGICAL .5 X3 (DISPOSABLE) ×2 IMPLANT
SOL ANTI-FOG 6CC FOG-OUT (MISCELLANEOUS) ×1 IMPLANT
SOL FOG-OUT ANTI-FOG 6CC (MISCELLANEOUS) ×1
SPLINT NASAL .50MM LRG (MISCELLANEOUS) ×2 IMPLANT
SPLINT NASAL SEPTAL BLV .50 ST (MISCELLANEOUS) ×2 IMPLANT
STRAP BODY AND KNEE 60X3 (MISCELLANEOUS) ×2 IMPLANT
SUT CHROMIC 4 0 RB 1X27 (SUTURE) ×2 IMPLANT
SUT ETHILON 3-0 FS-10 30 BLK (SUTURE) ×2
SUT PLAIN GUT 4-0 (SUTURE) ×4 IMPLANT
SUTURE EHLN 3-0 FS-10 30 BLK (SUTURE) ×1 IMPLANT
SYRINGE 10CC LL (SYRINGE) ×2 IMPLANT
TOWEL OR 17X26 4PK STRL BLUE (TOWEL DISPOSABLE) ×2 IMPLANT
WATER STERILE IRR 250ML POUR (IV SOLUTION) ×2 IMPLANT

## 2017-02-21 NOTE — Op Note (Signed)
..02/21/2017  12:32 PM    Natalie Wheeler  [id number]    Pre-Op Dx:  Deviated Nasal Septum, Hypertrophic Inferior Turbinates  Post-op Dx: Same  Proc: Nasal Septoplasty, Bilateral Partial Reduction Inferior Turbinates   Surg:  [name]  Anes:  GOT  EBL:  25ml  Comp:  none  Findings: significant bilateral inferior turbinate hypertrophy, right sided septal deviation bone and cartilage.  Moderate diffuse erythema of mucosa throughout nasal cavity.  Procedure: With the patient in a comfortable supine position,  general orotracheal anesthesia was induced without difficulty.  The patient received preoperative Afrin spray for topical decongestion and vasoconstriction.  At an appropriate level, the patient was placed in a semi-sitting position.  Nasal vibrissae were trimmed.   1% Xylocaine with 1:100,000 epinephrine, 8 cc's, was infiltrated into the anterior floor of the nose, into the nasal spine region, into the membranous columella, and finally into the submucoperichondrial plane of the septum on both sides.  Several minutes were allowed for this to take effect.  Cottoniod pledgetts soaked in Afrin were placed into both nasal cavities and left while the patient was prepped and draped in the standard fashion.   A proper time-out was performed.  The materials were removed from the nose and observed to be intact and correct in number.  The nose was inspected with a headlight and zero degree endoscope with the findings as described above.  A left Killian incision was sharply executed and carried down to the caudal edge of the quadrangular cartilage with a 15 blade scapel.  A mucoperichondrial flap was elelvated along the quadrangular plate back to the bony-cartilaginous junction using caudal elevator and freer elevator. The mucoperiostium was then elevated along the ethmoid plate and the vomer. An itracartilagenous incision was made using the freer elevator and a contralateral  mucoperichondiral flap was elevated using a freer elevator.  Care was taken to avoid any large rents or opposing rents in the mucoperichondrial flap.  Boney spurs of the vomer and maxillary crest were removed with Takahashi forceps.  The area of cartilagenous deviation was removed with combination of freer elevator and Takahashi forceps creating a widely patent nasal cavity as well as resolution of obstruction from the cartilagenous deviation. The mucosal flaps were placed back into their anatomic position to allow visualization of the airways. The septum now sat in the midline with an improved airway.  A 4-0 Chromic was used to close the Toston incision as well.   The inferior turbinates were then inspected.  Under endoscopic visualization, the inferior turbinates were infractured bilaterally with a Therapist, nutritional.  A kelly clamp was attached to the anterior-inferior third of each inferior turbinate for approximately one minute.  Under endoscopic visualization, Tru-cutting forceps were used to remove the anterior-inferior third of each inferior turbinate.  Electrocautery was used to control bleeding in the area. The remaining turbinate was then outfractured to open up the airway further. There was no significant bleeding noted. The right turbinate was then trimmed and outfractured in a similar fashion.  The airways were then visualized and showed open passageways on both sides that were significantly improved compared to before surgery.       There was no signifcant bleeding. Nasal splints were applied to both sides of the septum using Xomed 0.30mm small sized splints that were trimmed, and then held in position with a 3-0 Nylon through and through suture.  Stamberger sinufoam was placed along the cut edge of the inferior turbinates bilaterally.  The patient was  turned back over to anesthesia, and awakened, extubated, and taken to the PACU in satisfactory condition.  Dispo:   PACU to home  Plan:  Ice, elevation, narcotic analgesia, steroid taper, and prophylactic antibiotics for the duration of indwelling nasal foreign bodies.  We will reevaluate the patient in the office in 6 days and remove the septal splints.  Return to work in 10 days, strenuous activities in two weeks.   Natalie Wheeler 02/21/2017 12:32 PM

## 2017-02-21 NOTE — Anesthesia Procedure Notes (Signed)
Procedure Name: Intubation Date/Time: 02/21/2017 11:37 AM Performed by: Londell Moh Pre-anesthesia Checklist: Patient identified, Emergency Drugs available, Suction available, Patient being monitored and Timeout performed Patient Re-evaluated:Patient Re-evaluated prior to induction Oxygen Delivery Method: Circle system utilized Preoxygenation: Pre-oxygenation with 100% oxygen Induction Type: IV induction Ventilation: Mask ventilation without difficulty Laryngoscope Size: Mac and 3 Grade View: Grade I Tube type: Oral Rae Tube size: 7.0 mm Number of attempts: 1 Placement Confirmation: ETT inserted through vocal cords under direct vision,  positive ETCO2 and breath sounds checked- equal and bilateral Tube secured with: Tape Dental Injury: Teeth and Oropharynx as per pre-operative assessment

## 2017-02-21 NOTE — Anesthesia Preprocedure Evaluation (Signed)
Anesthesia Evaluation  Patient identified by MRN, date of birth, ID band Patient awake    Reviewed: Allergy & Precautions, H&P , NPO status , Patient's Chart, lab work & pertinent test results, reviewed documented beta blocker date and time   History of Anesthesia Complications Negative for: history of anesthetic complications  Airway Mallampati: II  TM Distance: >3 FB Neck ROM: full    Dental no notable dental hx.    Pulmonary former smoker,    Pulmonary exam normal breath sounds clear to auscultation       Cardiovascular Exercise Tolerance: Good hypertension,  Rhythm:regular Rate:Normal     Neuro/Psych  Neuromuscular disease negative psych ROS   GI/Hepatic negative GI ROS, Neg liver ROS,   Endo/Other  negative endocrine ROS  Renal/GU negative Renal ROS  negative genitourinary   Musculoskeletal   Abdominal   Peds  Hematology  (+) anemia ,   Anesthesia Other Findings   Reproductive/Obstetrics negative OB ROS                             Anesthesia Physical Anesthesia Plan  ASA: II  Anesthesia Plan: General and General ETT   Post-op Pain Management:    Induction:   PONV Risk Score and Plan: 3 and Ondansetron, Dexamethasone, Midazolam and Scopolamine patch - Pre-op  Airway Management Planned:   Additional Equipment:   Intra-op Plan:   Post-operative Plan:   Informed Consent: I have reviewed the patients History and Physical, chart, labs and discussed the procedure including the risks, benefits and alternatives for the proposed anesthesia with the patient or authorized representative who has indicated his/her understanding and acceptance.     Plan Discussed with: CRNA  Anesthesia Plan Comments:         Anesthesia Quick Evaluation

## 2017-02-21 NOTE — H&P (Signed)
..  History and Physical paper copy reviewed and updated date of procedure and will be scanned into system.  Patient seen and examined.  

## 2017-02-21 NOTE — Anesthesia Postprocedure Evaluation (Signed)
Anesthesia Post Note  Patient: Natalie Wheeler  Procedure(s) Performed: NASAL SEPTOPLASTY WITH  INFERIOR TURBINATE REDUCTION (Bilateral )  Patient location during evaluation: PACU Anesthesia Type: General Level of consciousness: awake and alert Pain management: pain level controlled Vital Signs Assessment: post-procedure vital signs reviewed and stable Respiratory status: spontaneous breathing, nonlabored ventilation, respiratory function stable and patient connected to nasal cannula oxygen Cardiovascular status: blood pressure returned to baseline and stable Postop Assessment: no apparent nausea or vomiting Anesthetic complications: no    Callista Hoh D Damarrion Mimbs

## 2017-02-21 NOTE — Transfer of Care (Signed)
Immediate Anesthesia Transfer of Care Note  Patient: Natalie Wheeler  Procedure(s) Performed: NASAL SEPTOPLASTY WITH  INFERIOR TURBINATE REDUCTION (Bilateral )  Patient Location: PACU  Anesthesia Type: General, General ETT  Level of Consciousness: awake, alert  and patient cooperative  Airway and Oxygen Therapy: Patient Spontanous Breathing and Patient connected to supplemental oxygen  Post-op Assessment: Post-op Vital signs reviewed, Patient's Cardiovascular Status Stable, Respiratory Function Stable, Patent Airway and No signs of Nausea or vomiting  Post-op Vital Signs: Reviewed and stable  Complications: No apparent anesthesia complications

## 2017-02-22 ENCOUNTER — Encounter: Payer: Self-pay | Admitting: Otolaryngology

## 2017-03-08 ENCOUNTER — Encounter: Payer: 59 | Admitting: Obstetrics and Gynecology

## 2017-03-16 ENCOUNTER — Other Ambulatory Visit: Payer: Self-pay | Admitting: *Deleted

## 2017-03-16 ENCOUNTER — Encounter: Payer: Self-pay | Admitting: Obstetrics and Gynecology

## 2017-03-16 ENCOUNTER — Other Ambulatory Visit: Payer: Self-pay | Admitting: Obstetrics and Gynecology

## 2017-03-16 ENCOUNTER — Ambulatory Visit (INDEPENDENT_AMBULATORY_CARE_PROVIDER_SITE_OTHER): Payer: 59 | Admitting: Obstetrics and Gynecology

## 2017-03-16 VITALS — BP 120/81 | HR 72 | Ht 65.0 in | Wt 148.4 lb

## 2017-03-16 DIAGNOSIS — N905 Atrophy of vulva: Secondary | ICD-10-CM | POA: Diagnosis not present

## 2017-03-16 DIAGNOSIS — N941 Unspecified dyspareunia: Secondary | ICD-10-CM

## 2017-03-16 DIAGNOSIS — Z01411 Encounter for gynecological examination (general) (routine) with abnormal findings: Secondary | ICD-10-CM | POA: Diagnosis not present

## 2017-03-16 MED ORDER — PROGESTERONE MICRONIZED 200 MG PO CAPS: 200.0000 mg | ORAL_CAPSULE | Freq: Every day | ORAL | 4 refills | Status: DC

## 2017-03-16 MED ORDER — ESTRADIOL 0.1 MG/GM VA CREA: 2.0000 g | TOPICAL_CREAM | Freq: Every day | VAGINAL | 2 refills | Status: DC

## 2017-03-16 NOTE — Progress Notes (Signed)
Subjective:   Natalie Wheeler is a 51 y.o. G0P0 Caucasian female here for a routine well-woman exam.  No LMP recorded. Patient is not currently having periods (Reason: Perimenopausal).    Current complaints: vaginal odor for a few weeks, had 3 weeks of antibiotics around sinus surgery on Oct 3rd.no menses since stopping OCPs in December. No sex drive, pain with sex-feels like it is tearing the skin. PCP: Graciela HusbandsKlein       doesn't desire labs  Social History: Sexual: heterosexual Marital Status: single Living situation: alone Occupation: unknown occupation Tobacco/alcohol: no tobacco use Illicit drugs: no history of illicit drug use  The following portions of the patient's history were reviewed and updated as appropriate: allergies, current medications, past family history, past medical history, past social history, past surgical history and problem list.  Past Medical History Past Medical History:  Diagnosis Date  . Anemia   . Cervicitis   . Decreased libido   . Degenerative disc disease, lumbar    L1-L5  . Dyspareunia   . Hypertension   . Lupus   . Lupus   . Motion sickness    ships, back seat cars  . Raynaud's disease   . Sciatica    right side  . Sjogren's disease (HCC)   . SVT (supraventricular tachycardia) (HCC)    controlled with metoprolol  . URI (upper respiratory infection)    Head cold, started last week. Antibiotic and prenisone taper rx'd on 02/12/17. Improved.  . Vaginal Pap smear, abnormal     Past Surgical History Past Surgical History:  Procedure Laterality Date  . BAND HEMORRHOIDECTOMY    . LEEP    . NASAL SEPTOPLASTY W/ TURBINOPLASTY Bilateral 02/21/2017   Procedure: NASAL SEPTOPLASTY WITH  INFERIOR TURBINATE REDUCTION;  Surgeon: Bud FaceVaught, Creighton, MD;  [facility];  Service: ENT;  Laterality: Bilateral;  . PRK Bilateral   . TONSILLECTOMY      Gynecologic History G0P0  No LMP recorded. Patient is not currently having periods (Reason:  Perimenopausal). Contraception: post menopausal status Last Pap: 2016. Results were: normal Last mammogram: 04/2006. Results were: normal   Obstetric History OB History  Gravida Para Term Preterm AB Living  0            SAB TAB Ectopic Multiple Live Births                   Current Medications Current Outpatient Prescriptions on File Prior to Visit  Medication Sig Dispense Refill  . ALPRAZolam (XANAX) 0.5 MG tablet Take 1 tablet (0.5 mg total) by mouth 3 (three) times daily as needed for anxiety. 60 tablet 2  . baclofen (LIORESAL) 20 MG tablet Take 20 mg by mouth as needed for muscle spasms.    . calcium-vitamin D (OSCAL WITH D) 250-125 MG-UNIT tablet Take 1 tablet by mouth daily.    . cetirizine (ZYRTEC) 10 MG tablet Take 10 mg by mouth daily.    . Cyanocobalamin (VITAMIN B-12 PO) Take by mouth daily.    . cycloSPORINE (RESTASIS) 0.05 % ophthalmic emulsion Place 1 drop into both eyes 2 (two) times daily.    . eszopiclone (LUNESTA) 2 MG TABS tablet Take 2 mg by mouth at bedtime as needed for sleep. Take immediately before bedtime    . gabapentin (NEURONTIN) 100 MG capsule Take 100 mg by mouth 4 (four) times daily. Pt takes BID    . hydroxychloroquine (PLAQUENIL) 200 MG tablet Take by mouth daily.    Marland Kitchen. MAGNESIUM  PO Take by mouth daily.    . metoprolol succinate (TOPROL-XL) 25 MG 24 hr tablet Take 25 mg by mouth daily.    . Multiple Vitamin (MULTIVITAMIN) tablet Take 1 tablet by mouth daily.    . polyethylene glycol (MIRALAX / GLYCOLAX) packet Take 17 g by mouth daily.    . sodium chloride (OCEAN) 0.65 % SOLN nasal spray Place 1 spray into both nostrils as needed for congestion.    Marland Kitchen amoxicillin-clavulanate (AUGMENTIN) 875-125 MG tablet Take 1 tablet by mouth 2 (two) times daily.     No current facility-administered medications on file prior to visit.     Review of Systems Patient denies any headaches, blurred vision, shortness of breath, chest pain, abdominal pain, problems with  bowel movements, urination, or intercourse.  Objective:  BP 120/81   Pulse 72   Ht 5\' 5"  (1.651 m)   Wt 148 lb 6.4 oz (67.3 kg)   BMI 24.70 kg/m  Physical Exam  General:  Well developed, well nourished, no acute distress. She is alert and oriented x3. Skin:  Warm and dry Neck:  Midline trachea, no thyromegaly or nodules Cardiovascular: Regular rate and rhythm, no murmur heard Lungs:  Effort normal, all lung fields clear to auscultation bilaterally Breasts:  No dominant palpable mass, retraction, or nipple discharge Abdomen:  Soft, non tender, no hepatosplenomegaly or masses Pelvic:  External genitalia is normal but atrophic & pale in appearance.  The vagina is severely atrophic with scarring . The cervix is bulbous, no CMT.  Thin prep pap is not done. Uterus is felt to be normal size, shape, and contour.  No adnexal masses or tenderness noted. Extremities:  No swelling or varicosities noted Psych:  She has a normal mood and affect  Assessment:   Healthy well-woman exam Vaginal atrophy dysparenia   Plan:  Will try etsrace cream qohs for 2 months and add prometrium nightly days 1-25 each month. RTC in 6-8 weeks for recheck. F/U 1 year for AE, or sooner if needed Mammogram ordered or sooner if problems   Opie Maclaughlin Suzan Nailer, CNM

## 2017-03-16 NOTE — Patient Instructions (Signed)
Estradiol vaginal cream What is this medicine? ESTRADIOL (es tra DYE ole) contains the female hormone estrogen. It is used for symptoms of menopause, like vaginal dryness and irritation. This medicine may be used for other purposes; ask your health care provider or pharmacist if you have questions. COMMON BRAND NAME(S): Estrace What should I tell my health care provider before I take this medicine? They need to know if you have any of these conditions: -abnormal vaginal bleeding -blood vessel disease or blood clots -breast, cervical, endometrial, ovarian, liver, or uterine cancer -dementia -diabetes -gallbladder disease -heart disease or recent heart attack -high blood pressure -high cholesterol -high levels of calcium in the blood -hysterectomy -kidney disease -liver disease -migraine headaches -protein C deficiency -protein S deficiency -stroke -systemic lupus erythematosus (SLE) -tobacco smoker -an unusual or allergic reaction to estrogens, other hormones, soy, other medicines, foods, dyes, or preservatives -pregnant or trying to get pregnant -breast-feeding How should I use this medicine? This medicine is for use in the vagina only. Do not take by mouth. Follow the directions on the prescription label. Read package directions carefully before using. Use the special applicator supplied with the cream. Wash hands before and after use. Fill the applicator with the prescribed amount of cream. Lie on your back, part and bend your knees. Insert the applicator into the vagina and push the plunger to expel the cream into the vagina. Wash the applicator with warm soapy water and rinse well. Use exactly as directed for the complete length of time prescribed. Do not stop using except on the advice of your doctor or health care professional. A patient package insert for the product will be given with each prescription and refill. Read this sheet carefully each time. The sheet may change  frequently. Talk to your pediatrician regarding the use of this medicine in children. This medicine is not approved for use in children. Overdosage: If you think you have taken too much of this medicine contact a poison control center or emergency room at once. NOTE: This medicine is only for you. Do not share this medicine with others. What if I miss a dose? If you miss a dose, use it as soon as you can. If it is almost time for your next dose, use only that dose. Do not use double or extra doses. What may interact with this medicine? Do not take this medicine with any of the following medications: -aromatase inhibitors like aminoglutethimide, anastrozole, exemestane, letrozole, testolactone This medicine may also interact with the following medications: -barbiturates used for inducing sleep or treating seizures -carbamazepine -grapefruit juice -medicines for fungal infections like ketoconazole and itraconazole -raloxifene -rifabutin -rifampin -rifapentine -ritonavir -some antibiotics used to treat infections -St. John's Wort -tamoxifen -warfarin This list may not describe all possible interactions. Give your health care provider a list of all the medicines, herbs, non-prescription drugs, or dietary supplements you use. Also tell them if you smoke, drink alcohol, or use illegal drugs. Some items may interact with your medicine. What should I watch for while using this medicine? Visit your health care professional for regular checks on your progress. You will need a regular breast and pelvic exam. You should also discuss the need for regular mammograms with your health care professional, and follow his or her guidelines. This medicine can make your body retain fluid, making your fingers, hands, or ankles swell. Your blood pressure can go up. Contact your doctor or health care professional if you feel you are retaining fluid. If you have   any reason to think you are pregnant, stop taking  this medicine at once and contact your doctor or health care professional. Tobacco smoking increases the risk of getting a blood clot or having a stroke, especially if you are more than 52 years old. You are strongly advised not to smoke. If you wear contact lenses and notice visual changes, or if the lenses begin to feel uncomfortable, consult your eye care specialist. If you are going to have elective surgery, you may need to stop taking this medicine beforehand. Consult your health care professional for advice prior to scheduling the surgery. What side effects may I notice from receiving this medicine? Side effects that you should report to your doctor or health care professional as soon as possible: -allergic reactions like skin rash, itching or hives, swelling of the face, lips, or tongue -breast tissue changes or discharge -changes in vision -chest pain -confusion, trouble speaking or understanding -dark urine -general ill feeling or flu-like symptoms -light-colored stools -nausea, vomiting -pain, swelling, warmth in the leg -right upper belly pain -severe headaches -shortness of breath -sudden numbness or weakness of the face, arm or leg -trouble walking, dizziness, loss of balance or coordination -unusual vaginal bleeding -yellowing of the eyes or skin Side effects that usually do not require medical attention (report to your doctor or health care professional if they continue or are bothersome): -hair loss -increased hunger or thirst -increased urination -symptoms of vaginal infection like itching, irritation or unusual discharge -unusually weak or tired This list may not describe all possible side effects. Call your doctor for medical advice about side effects. You may report side effects to FDA at 1-800-FDA-1088. Where should I keep my medicine? Keep out of the reach of children. Store at room temperature between 15 and 30 degrees C (59 and 86 degrees F). Protect from  temperatures above 40 degrees C (104 degrees C). Do not freeze. Throw away any unused medicine after the expiration date. NOTE: This sheet is a summary. It may not cover all possible information. If you have questions about this medicine, talk to your doctor, pharmacist, or health care provider.  2018 Elsevier/Gold Standard (2010-08-03 09:18:12)  

## 2017-04-25 ENCOUNTER — Ambulatory Visit
Admission: RE | Admit: 2017-04-25 | Discharge: 2017-04-25 | Disposition: A | Discharge status: Home / Self Care | Payer: 59 | Source: Ambulatory Visit | Attending: Obstetrics and Gynecology | Admitting: Obstetrics and Gynecology

## 2017-04-25 DIAGNOSIS — Z01411 Encounter for gynecological examination (general) (routine) with abnormal findings: Secondary | ICD-10-CM | POA: Diagnosis not present

## 2017-04-25 DIAGNOSIS — Z1231 Encounter for screening mammogram for malignant neoplasm of breast: Secondary | ICD-10-CM | POA: Diagnosis not present

## 2017-05-02 DIAGNOSIS — E538 Deficiency of other specified B group vitamins: Secondary | ICD-10-CM | POA: Diagnosis not present

## 2017-05-02 DIAGNOSIS — I10 Essential (primary) hypertension: Secondary | ICD-10-CM | POA: Diagnosis not present

## 2017-05-03 ENCOUNTER — Encounter: Payer: Self-pay | Admitting: Obstetrics and Gynecology

## 2017-05-03 ENCOUNTER — Ambulatory Visit (INDEPENDENT_AMBULATORY_CARE_PROVIDER_SITE_OTHER): Payer: 59 | Admitting: Obstetrics and Gynecology

## 2017-05-03 VITALS — BP 112/62 | HR 88 | Wt 149.4 lb

## 2017-05-03 DIAGNOSIS — N952 Postmenopausal atrophic vaginitis: Secondary | ICD-10-CM | POA: Diagnosis not present

## 2017-05-03 NOTE — Progress Notes (Signed)
Subjective:     Patient ID: Natalie Wheeler, female   DOB: [birth year], 51 y.o.   MRN: [id number]  HPI Here for recheck on vaginal atrophy and new start of estrogen cream. Denies any concerns other than cream being messy. Has not attempted intercourse with vaginal penetration.   Review of Systems negative    Objective:   Physical Exam A&Ox4 Well groomed female in no distress Blood pressure 112/62, pulse 88, weight 149 lb 6.4 oz (67.8 kg). Pelvic exam: normal external genitalia, vulva, vagina, cervix, uterus and adnexa, VAGINA: atrophic but 50% improvement since last visit. No tenderness.    Assessment:     Vaginal atrophy Medication management.     Plan:     Will reduce estrace cream to twice weekly x 2 months then weekly. Encouraged resuming sexual activity. RTC as needed.   Jevonte Clanton,CNM

## 2017-05-09 DIAGNOSIS — I10 Essential (primary) hypertension: Secondary | ICD-10-CM | POA: Diagnosis not present

## 2017-05-09 DIAGNOSIS — Z0000 Encounter for general adult medical examination without abnormal findings: Secondary | ICD-10-CM | POA: Diagnosis not present

## 2017-05-09 DIAGNOSIS — M5416 Radiculopathy, lumbar region: Secondary | ICD-10-CM | POA: Diagnosis not present

## 2017-05-09 DIAGNOSIS — D869 Sarcoidosis, unspecified: Secondary | ICD-10-CM | POA: Diagnosis not present

## 2017-05-14 DIAGNOSIS — Z23 Encounter for immunization: Secondary | ICD-10-CM | POA: Diagnosis not present

## 2017-05-24 DIAGNOSIS — R1319 Other dysphagia: Secondary | ICD-10-CM | POA: Insufficient documentation

## 2017-08-02 ENCOUNTER — Encounter: Payer: Self-pay | Admitting: *Deleted

## 2017-08-03 ENCOUNTER — Ambulatory Visit: Payer: No Typology Code available for payment source | Admitting: Anesthesiology

## 2017-08-03 ENCOUNTER — Encounter
Admission: RE | Disposition: A | Discharge status: Home / Self Care | Payer: Self-pay | Source: Ambulatory Visit | Attending: Unknown Physician Specialty

## 2017-08-03 ENCOUNTER — Ambulatory Visit
Admission: RE | Admit: 2017-08-03 | Discharge: 2017-08-03 | Disposition: A | Discharge status: Home / Self Care | Payer: No Typology Code available for payment source | Source: Ambulatory Visit | Attending: Unknown Physician Specialty | Admitting: Unknown Physician Specialty

## 2017-08-03 DIAGNOSIS — Z833 Family history of diabetes mellitus: Secondary | ICD-10-CM | POA: Insufficient documentation

## 2017-08-03 DIAGNOSIS — Z882 Allergy status to sulfonamides status: Secondary | ICD-10-CM | POA: Diagnosis not present

## 2017-08-03 DIAGNOSIS — I10 Essential (primary) hypertension: Secondary | ICD-10-CM | POA: Diagnosis not present

## 2017-08-03 DIAGNOSIS — M5136 Other intervertebral disc degeneration, lumbar region: Secondary | ICD-10-CM | POA: Insufficient documentation

## 2017-08-03 DIAGNOSIS — M5431 Sciatica, right side: Secondary | ICD-10-CM | POA: Diagnosis not present

## 2017-08-03 DIAGNOSIS — Z888 Allergy status to other drugs, medicaments and biological substances status: Secondary | ICD-10-CM | POA: Insufficient documentation

## 2017-08-03 DIAGNOSIS — D649 Anemia, unspecified: Secondary | ICD-10-CM | POA: Diagnosis not present

## 2017-08-03 DIAGNOSIS — Z79899 Other long term (current) drug therapy: Secondary | ICD-10-CM | POA: Diagnosis not present

## 2017-08-03 DIAGNOSIS — M329 Systemic lupus erythematosus, unspecified: Secondary | ICD-10-CM | POA: Diagnosis not present

## 2017-08-03 DIAGNOSIS — Z87891 Personal history of nicotine dependence: Secondary | ICD-10-CM | POA: Diagnosis not present

## 2017-08-03 DIAGNOSIS — Z1211 Encounter for screening for malignant neoplasm of colon: Secondary | ICD-10-CM | POA: Insufficient documentation

## 2017-08-03 DIAGNOSIS — Z8041 Family history of malignant neoplasm of ovary: Secondary | ICD-10-CM | POA: Diagnosis not present

## 2017-08-03 DIAGNOSIS — K640 First degree hemorrhoids: Secondary | ICD-10-CM | POA: Insufficient documentation

## 2017-08-03 DIAGNOSIS — Z881 Allergy status to other antibiotic agents status: Secondary | ICD-10-CM | POA: Insufficient documentation

## 2017-08-03 DIAGNOSIS — Z803 Family history of malignant neoplasm of breast: Secondary | ICD-10-CM | POA: Insufficient documentation

## 2017-08-03 DIAGNOSIS — K5730 Diverticulosis of large intestine without perforation or abscess without bleeding: Secondary | ICD-10-CM | POA: Diagnosis not present

## 2017-08-03 DIAGNOSIS — M3500 Sicca syndrome, unspecified: Secondary | ICD-10-CM | POA: Insufficient documentation

## 2017-08-03 DIAGNOSIS — Z8371 Family history of colonic polyps: Secondary | ICD-10-CM | POA: Diagnosis not present

## 2017-08-03 DIAGNOSIS — I471 Supraventricular tachycardia: Secondary | ICD-10-CM | POA: Insufficient documentation

## 2017-08-03 DIAGNOSIS — I7300 Raynaud's syndrome without gangrene: Secondary | ICD-10-CM | POA: Diagnosis not present

## 2017-08-03 HISTORY — DX: Raynaud's syndrome without gangrene: I73.00

## 2017-08-03 HISTORY — PX: COLONOSCOPY WITH PROPOFOL: SHX5780

## 2017-08-03 LAB — POCT PREGNANCY, URINE: Preg Test, Ur: NEGATIVE

## 2017-08-03 SURGERY — COLONOSCOPY WITH PROPOFOL
Anesthesia: General

## 2017-08-03 MED ORDER — LIDOCAINE HCL (PF) 2 % IJ SOLN
INTRAMUSCULAR | Status: AC
  Filled 2017-08-03: qty 10

## 2017-08-03 MED ORDER — FENTANYL CITRATE (PF) 100 MCG/2ML IJ SOLN
INTRAMUSCULAR | Status: DC | PRN
  Administered 2017-08-03: 50 ug via INTRAVENOUS
  Administered 2017-08-03: 25 ug via INTRAVENOUS

## 2017-08-03 MED ORDER — SODIUM CHLORIDE 0.9 % IV SOLN
INTRAVENOUS | Status: DC
  Administered 2017-08-03 (×2): via INTRAVENOUS

## 2017-08-03 MED ORDER — PROPOFOL 500 MG/50ML IV EMUL
INTRAVENOUS | Status: DC | PRN
  Administered 2017-08-03: 160 ug/kg/min via INTRAVENOUS

## 2017-08-03 MED ORDER — PROPOFOL 10 MG/ML IV BOLUS
INTRAVENOUS | Status: DC | PRN
  Administered 2017-08-03: 20 mg via INTRAVENOUS
  Administered 2017-08-03: 35 mg via INTRAVENOUS
  Administered 2017-08-03: 20 mg via INTRAVENOUS

## 2017-08-03 MED ORDER — MIDAZOLAM HCL 2 MG/2ML IJ SOLN
INTRAMUSCULAR | Status: DC | PRN
  Administered 2017-08-03: 2 mg via INTRAVENOUS

## 2017-08-03 MED ORDER — MIDAZOLAM HCL 2 MG/2ML IJ SOLN
INTRAMUSCULAR | Status: AC
  Filled 2017-08-03: qty 2

## 2017-08-03 MED ORDER — FENTANYL CITRATE (PF) 100 MCG/2ML IJ SOLN
INTRAMUSCULAR | Status: AC
  Filled 2017-08-03: qty 2

## 2017-08-03 MED ORDER — PROPOFOL 500 MG/50ML IV EMUL
INTRAVENOUS | Status: AC
Start: 2017-08-03 — End: 2017-08-03
  Filled 2017-08-03: qty 50

## 2017-08-03 MED ORDER — LIDOCAINE HCL (CARDIAC) 20 MG/ML IV SOLN
INTRAVENOUS | Status: DC | PRN
  Administered 2017-08-03: 50 mg via INTRAVENOUS

## 2017-08-03 MED ORDER — SODIUM CHLORIDE 0.9 % IV SOLN: INTRAVENOUS | Status: DC

## 2017-08-03 NOTE — Anesthesia Preprocedure Evaluation (Signed)
Anesthesia Evaluation  Patient identified by MRN, date of birth, ID band Patient awake    Reviewed: Allergy & Precautions, H&P , NPO status , Patient's Chart, lab work & pertinent test results, reviewed documented beta blocker date and time   History of Anesthesia Complications (+) PONV and history of anesthetic complications  Airway Mallampati: II  TM Distance: >3 FB Neck ROM: full    Dental  (+) Dental Advidsory Given, Teeth Intact   Pulmonary neg pulmonary ROS, former smoker,           Cardiovascular Exercise Tolerance: Good hypertension, (-) angina(-) CAD, (-) Past MI, (-) Cardiac Stents and (-) CABG + dysrhythmias Supra Ventricular Tachycardia (-) Valvular Problems/Murmurs     Neuro/Psych  Neuromuscular disease negative psych ROS   GI/Hepatic negative GI ROS, Neg liver ROS,   Endo/Other  negative endocrine ROS  Renal/GU negative Renal ROS  negative genitourinary   Musculoskeletal   Abdominal   Peds  Hematology  (+) anemia ,   Anesthesia Other Findings Past Medical History: No date: Anemia No date: Cervicitis No date: Decreased libido No date: Degenerative disc disease, lumbar     Comment:  L1-L5 No date: Dyspareunia No date: Hypertension No date: Lupus No date: Lupus No date: Motion sickness     Comment:  ships, back seat cars No date: Raynaud's disease No date: Raynaud's syndrome No date: Raynauds syndrome No date: Sciatica     Comment:  right side No date: Sjogren's disease (HCC) No date: SVT (supraventricular tachycardia) (HCC)     Comment:  controlled with metoprolol No date: URI (upper respiratory infection)     Comment:  Head cold, started last week. Antibiotic and prenisone               taper rx'd on 02/12/17. Improved. No date: Vaginal Pap smear, abnormal   Reproductive/Obstetrics negative OB ROS                             Anesthesia Physical  Anesthesia  Plan  ASA: II  Anesthesia Plan: General and General ETT   Post-op Pain Management:    Induction: Intravenous  PONV Risk Score and Plan: 4 or greater and Propofol infusion  Airway Management Planned: Natural Airway and Nasal Cannula  Additional Equipment:   Intra-op Plan:   Post-operative Plan:   Informed Consent: I have reviewed the patients History and Physical, chart, labs and discussed the procedure including the risks, benefits and alternatives for the proposed anesthesia with the patient or authorized representative who has indicated his/her understanding and acceptance.     Plan Discussed with: CRNA  Anesthesia Plan Comments:         Anesthesia Quick Evaluation

## 2017-08-03 NOTE — H&P (Signed)
Primary Care Physician:  Lynnea FerrierKlein, Bert J III, MD Primary Gastroenterologist:  Dr. Mechele CollinElliott  Pre-Procedure History & Physical: HPI:  Natalie Wheeler is a 52 y.o. female is here for an colonoscopy.  Colon screening and FH colon polyps in father.    Past Medical History:  Diagnosis Date  . Anemia   . Cervicitis   . Decreased libido   . Degenerative disc disease, lumbar    L1-L5  . Dyspareunia   . Hypertension   . Lupus   . Lupus   . Motion sickness    ships, back seat cars  . Raynaud's disease   . Raynaud's syndrome   . Raynauds syndrome   . Sciatica    right side  . Sjogren's disease (HCC)   . SVT (supraventricular tachycardia) (HCC)    controlled with metoprolol  . URI (upper respiratory infection)    Head cold, started last week. Antibiotic and prenisone taper rx'd on 02/12/17. Improved.  . Vaginal Pap smear, abnormal     Past Surgical History:  Procedure Laterality Date  . BAND HEMORRHOIDECTOMY    . LEEP    . NASAL SEPTOPLASTY W/ TURBINOPLASTY Bilateral 02/21/2017   Procedure: NASAL SEPTOPLASTY WITH  INFERIOR TURBINATE REDUCTION;  Surgeon: Bud FaceVaught, Creighton, MD;  [facility];  Service: ENT;  Laterality: Bilateral;  . PRK Bilateral   . TONSILLECTOMY      Prior to Admission medications   Medication Sig Start Date End Date Taking? Authorizing Provider  ALPRAZolam Prudy Feeler(XANAX) 0.5 MG tablet Take 1 tablet (0.5 mg total) by mouth 3 (three) times daily as needed for anxiety. 02/23/16  Yes Shambley, Melody N, CNM  baclofen (LIORESAL) 20 MG tablet Take 20 mg by mouth as needed for muscle spasms.   Yes [provider]  calcium-vitamin D (OSCAL WITH D) 250-125 MG-UNIT tablet Take 1 tablet by mouth daily.   Yes [provider]  cetirizine (ZYRTEC) 10 MG tablet Take 10 mg by mouth daily.   Yes [provider]  Cyanocobalamin (VITAMIN B-12 PO) Take by mouth daily.   Yes [provider]  cycloSPORINE (RESTASIS) 0.05 % ophthalmic  emulsion Place 1 drop into both eyes 2 (two) times daily.   Yes [provider]  diclofenac (VOLTAREN) 50 MG EC tablet Take 50 mg by mouth 2 (two) times daily.   Yes [provider]  estradiol (ESTRACE VAGINAL) 0.1 MG/GM vaginal cream Place 2 g vaginally daily. 03/16/17  Yes Shambley, Melody N, CNM  eszopiclone (LUNESTA) 2 MG TABS tablet Take 2 mg by mouth at bedtime as needed for sleep. Take immediately before bedtime   Yes [provider]  gabapentin (NEURONTIN) 100 MG capsule Take 100 mg by mouth 4 (four) times daily. Pt takes BID   Yes [provider]  hydroxychloroquine (PLAQUENIL) 200 MG tablet Take by mouth daily.   Yes [provider]  Linaclotide (LINZESS PO) Take by mouth.   Yes [provider]  MAGNESIUM PO Take by mouth daily.   Yes [provider]  metoprolol succinate (TOPROL-XL) 25 MG 24 hr tablet Take 25 mg by mouth daily.   Yes [provider]  Multiple Vitamin (MULTIVITAMIN) tablet Take 1 tablet by mouth daily.   Yes [provider]  polyethylene glycol (MIRALAX / GLYCOLAX) packet Take 17 g by mouth daily.   Yes [provider]  progesterone (PROMETRIUM) 200 MG capsule Take 1 capsule (200 mg total) by mouth daily. 03/16/17  Yes Purcell NailsShambley, Melody N, CNM  sodium chloride (OCEAN) 0.65 % SOLN nasal spray Place 1 spray into both nostrils as needed for congestion.   Yes [provider]    Allergies as of 06/18/2017 - Review Complete 05/03/2017  Allergen Reaction Noted  . Floxin [ofloxacin] Shortness Of Breath 02/10/2015  . Buspirone Nausea Only 02/14/2017  . Ciprofloxacin  02/14/2017  . Doxycycline  02/10/2015  . Mobic [meloxicam]  02/10/2015  . Nifedipine  02/14/2017  . Sulfadiazine  02/10/2015  . Tramadol Nausea Only 02/10/2015  . Zofran [ondansetron hcl]  03/16/2017  . Sulfa antibiotics Rash 02/14/2017    Family History  Problem Relation Age of Onset  . Breast cancer Paternal  Aunt 24  . Diabetes Maternal Grandmother   . Heart disease Maternal Grandfather   . Heart disease Paternal Grandmother   . Cancer Maternal Aunt        ovarian    Social History   Socioeconomic History  . Marital status: Single    Spouse name: Not on file  . Number of children: Not on file  . Years of education: Not on file  . Highest education level: Not on file  Occupational History  . Not on file  Social Needs  . Financial resource strain: Not on file  . Food insecurity:    Worry: Not on file    Inability: Not on file  . Transportation needs:    Medical: Not on file    Non-medical: Not on file  Tobacco Use  . Smoking status: Former Smoker    Last attempt to quit: 1993    Years since quitting: 26.2  . Smokeless tobacco: Never Used  Substance and Sexual Activity  . Alcohol use: Yes    Alcohol/week: 1.8 oz    Types: 3 Glasses of wine per week    Comment: none last 24hrs  . Drug use: No  . Sexual activity: Yes  Lifestyle  . Physical activity:    Days per week: Not on file    Minutes per session: Not on file  . Stress: Not on file  Relationships  . Social connections:    Talks on phone: Not on file    Gets together: Not on file    Attends religious service: Not on file    Active member of club or organization: Not on file    Attends meetings of clubs or organizations: Not on file    Relationship status: Not on file  . Intimate partner violence:    Fear of current or ex partner: Not on file    Emotionally abused: Not on file    Physically abused: Not on file    Forced sexual activity: Not on file  Other Topics Concern  . Not on file  Social History Narrative  . Not on file    Review of Systems: See HPI, otherwise negative ROS  Physical Exam: BP 134/66   Pulse 69   Temp 98.1 F (36.7 C) (Tympanic)   Resp 16   Ht 5\' 5"  (1.651 m)   Wt 63.5 kg (140 lb)   SpO2 100%   BMI 23.30 kg/m  General:   Alert,  pleasant and cooperative in NAD Head:   Normocephalic and atraumatic. Neck:  Supple; no masses or thyromegaly. Lungs:  Clear throughout to auscultation.    Heart:  Regular rate and rhythm. Abdomen:  Soft, nontender and nondistended. Normal bowel sounds, without guarding, and without rebound.   Neurologic:  Alert and  oriented x4;  grossly normal neurologically.  Impression/Plan: Natalie Wheeler is here for an colonoscopy to be performed for Colon screening and FH coon polyps in father.  Risks, benefits, limitations, and alternatives regarding  colonoscopy have been reviewed with the patient.  Questions have been answered.  All parties agreeable.   Lynnae Prude, MD  08/03/2017, 8:11 AM

## 2017-08-03 NOTE — Anesthesia Post-op Follow-up Note (Signed)
Anesthesia QCDR form completed.        

## 2017-08-03 NOTE — Anesthesia Procedure Notes (Addendum)
Date/Time: 08/03/2017 8:27 AM Performed by: Henrietta HooverPope, Brytni Dray, CRNA Pre-anesthesia Checklist: Patient identified, Emergency Drugs available, Suction available, Patient being monitored and Timeout performed Oxygen Delivery Method: Nasal cannula Placement Confirmation: positive ETCO2

## 2017-08-03 NOTE — Anesthesia Postprocedure Evaluation (Signed)
Anesthesia Post Note  Patient: Natalie Wheeler  Procedure(s) Performed: COLONOSCOPY WITH PROPOFOL (N/A )  Patient location during evaluation: Endoscopy Anesthesia Type: General Level of consciousness: awake and alert Pain management: pain level controlled Vital Signs Assessment: post-procedure vital signs reviewed and stable Respiratory status: spontaneous breathing, nonlabored ventilation, respiratory function stable and patient connected to nasal cannula oxygen Cardiovascular status: blood pressure returned to baseline and stable Postop Assessment: no apparent nausea or vomiting Anesthetic complications: no     Last Vitals:  Vitals Value Taken Time  BP    Temp    Pulse    Resp    SpO2      Last Pain:  Vitals:   08/03/17 0935  TempSrc:   PainSc: 0-No pain                 Lenard SimmerAndrew Aneudy Champlain

## 2017-08-03 NOTE — Op Note (Signed)
[facility] Gastroenterology Patient Name: Natalie Wheeler Procedure Date: 08/03/2017 8:08 AM MRN: [id number] Account #: [id number] Date of Birth: June 17, 1965 Admit Type: Outpatient Age: 52 Room: [facility] ENDO ROOM 1 Gender: Female Note Status: Finalized Procedure:            Colonoscopy Indications:          Colon cancer screening in patient at increased risk:                        Family history of 1st-degree relative with colon polyps Providers:            Scot Jun, MD Medicines:            Propofol per Anesthesia Complications:        No immediate complications. Procedure:            Pre-Anesthesia Assessment:                       - After reviewing the risks and benefits, the patient                        was deemed in satisfactory condition to undergo the                        procedure.                       After obtaining informed consent, the colonoscope was                        passed under direct vision. Throughout the procedure,                        the patient's blood pressure, pulse, and oxygen                        saturations were monitored continuously. The                        Colonoscope was introduced through the anus and                        advanced to the the cecum, identified by appendiceal                        orifice and ileocecal valve. The colonoscopy was                        somewhat difficult due to tortuous colon and stool in                        some areas had to be suctioned. Successful completion                        of the procedure was aided by lavage. The patient                        tolerated the procedure well. The quality of the bowel  preparation was adequate to identify polyps. Findings:      Internal hemorrhoids were found during endoscopy. The hemorrhoids were       small and Grade I (internal hemorrhoids that do not prolapse).      A single small-mouthed diverticulum was  found in the ascending colon.      A moderate amount of semi-liquid stool was found in the sigmoid colon,       in the descending colon, in the transverse colon and in the ascending       colon, without interference to visualization. Lavage of the area was       performed using greater than 500 mL of sterile water, resulting in       clearance with adequate visualization. Impression:           - Internal hemorrhoids.                       - Diverticulosis in the ascending colon.                       - Stool in the sigmoid colon, in the descending colon,                        in the transverse colon and in the ascending colon.                       - No specimens collected. Recommendation:       - Repeat colonoscopy in 5 years for screening purposes. Scot Junobert T Daejon Lich, MD 08/03/2017 9:03:12 AM This report has been signed electronically. Number of Addenda: 0 Note Initiated On: 08/03/2017 8:08 AM Scope Withdrawal Time: 0 hours 10 minutes 13 seconds  Total Procedure Duration: 0 hours 26 minutes 36 seconds       [facility]

## 2017-08-03 NOTE — Transfer of Care (Signed)
Immediate Anesthesia Transfer of Care Note  Patient: Natalie Wheeler  Procedure(s) Performed: COLONOSCOPY WITH PROPOFOL (N/A )  Patient Location: PACU  Anesthesia Type:General  Level of Consciousness: sedated  Airway & Oxygen Therapy: Patient Spontanous Breathing  Post-op Assessment: Report given to RN and Post -op Vital signs reviewed and stable  Post vital signs: Reviewed and stable  Last Vitals:  Vitals Value Taken Time  BP 132/52 08/03/2017  9:03 AM  Temp    Pulse 80 08/03/2017  9:05 AM  Resp 15 08/03/2017  9:05 AM  SpO2 100 % 08/03/2017  9:05 AM  Vitals shown include unvalidated device data.  Last Pain:  Vitals:   08/03/17 0808  TempSrc: Tympanic         Complications: No apparent anesthesia complications

## 2017-08-06 ENCOUNTER — Encounter: Payer: Self-pay | Admitting: Unknown Physician Specialty

## 2017-11-19 ENCOUNTER — Other Ambulatory Visit: Payer: Self-pay | Admitting: Obstetrics and Gynecology

## 2018-01-08 ENCOUNTER — Other Ambulatory Visit: Payer: Self-pay | Admitting: Obstetrics and Gynecology

## 2018-03-14 ENCOUNTER — Other Ambulatory Visit: Payer: Self-pay | Admitting: Obstetrics and Gynecology

## 2018-03-14 DIAGNOSIS — Z1231 Encounter for screening mammogram for malignant neoplasm of breast: Secondary | ICD-10-CM

## 2018-03-21 ENCOUNTER — Encounter: Payer: Self-pay | Admitting: Obstetrics and Gynecology

## 2018-03-21 ENCOUNTER — Ambulatory Visit (INDEPENDENT_AMBULATORY_CARE_PROVIDER_SITE_OTHER): Payer: No Typology Code available for payment source | Admitting: Obstetrics and Gynecology

## 2018-03-21 VITALS — BP 138/81 | HR 86 | Wt 147.0 lb

## 2018-03-21 DIAGNOSIS — Z01419 Encounter for gynecological examination (general) (routine) without abnormal findings: Secondary | ICD-10-CM | POA: Diagnosis not present

## 2018-03-21 NOTE — Progress Notes (Signed)
Subjective:   Natalie Wheeler is a 52 y.o. G0P0 Caucasian female here for a routine well-woman exam.  No LMP recorded. (Menstrual status: Perimenopausal).    Current complaints: has been stressed due to loss of father and then her two dog PCP: Graciela Husbands       doesn't desire labs  Social History: Sexual: heterosexual Marital Status: co-habitating Living situation: with partner / significant other Occupation: unknown occupation Tobacco/alcohol: no tobacco use Illicit drugs: no history of illicit drug use  The following portions of the patient's history were reviewed and updated as appropriate: allergies, current medications, past family history, past medical history, past social history, past surgical history and problem list.  Past Medical History Past Medical History:  Diagnosis Date  . Anemia   . Cervicitis   . Decreased libido   . Degenerative disc disease, lumbar    L1-L5  . Dyspareunia   . Hypertension   . Lupus (HCC)   . Lupus (HCC)   . Motion sickness    ships, back seat cars  . Raynaud's disease   . Raynaud's syndrome   . Raynauds syndrome   . Sciatica    right side  . Sjogren's disease (HCC)   . SVT (supraventricular tachycardia) (HCC)    controlled with metoprolol  . URI (upper respiratory infection)    Head cold, started last week. Antibiotic and prenisone taper rx'd on 02/12/17. Improved.  . Vaginal Pap smear, abnormal     Past Surgical History Past Surgical History:  Procedure Laterality Date  . BAND HEMORRHOIDECTOMY    . COLONOSCOPY WITH PROPOFOL N/A 08/03/2017   Procedure: COLONOSCOPY WITH PROPOFOL;  Surgeon: Scot Jun, MD;  [facility];  Service: Endoscopy;  Laterality: N/A;  . LEEP    . NASAL SEPTOPLASTY W/ TURBINOPLASTY Bilateral 02/21/2017   Procedure: NASAL SEPTOPLASTY WITH  INFERIOR TURBINATE REDUCTION;  Surgeon: Bud Face, MD;  Location: [facility];  Service: ENT;  Laterality: Bilateral;  . PRK Bilateral   .  TONSILLECTOMY      Gynecologic History G0P0  No LMP recorded. (Menstrual status: Perimenopausal). Contraception: post menopausal status Last Pap: 2016. Results were: normal Last mammogram: 04/2017. Results were: normal   Obstetric History OB History  Gravida Para Term Preterm AB Living  0            SAB TAB Ectopic Multiple Live Births               Current Medications Current Outpatient Medications on File Prior to Visit  Medication Sig Dispense Refill  . ALPRAZolam (XANAX) 0.5 MG tablet Take 1 tablet (0.5 mg total) by mouth 3 (three) times daily as needed for anxiety. 60 tablet 2  . baclofen (LIORESAL) 20 MG tablet Take 20 mg by mouth as needed for muscle spasms.    . calcium-vitamin D (OSCAL WITH D) 250-125 MG-UNIT tablet Take 1 tablet by mouth daily.    . cetirizine (ZYRTEC) 10 MG tablet Take 10 mg by mouth daily.    . Cyanocobalamin (VITAMIN B-12 PO) Take by mouth daily.    . cycloSPORINE (RESTASIS) 0.05 % ophthalmic emulsion Place 1 drop into both eyes 2 (two) times daily.    . diclofenac (VOLTAREN) 50 MG EC tablet Take 50 mg by mouth 2 (two) times daily.    . hydroxychloroquine (PLAQUENIL) 200 MG tablet Take by mouth daily.    . Linaclotide (LINZESS PO) Take by mouth.    Marland Kitchen MAGNESIUM PO Take by mouth daily.    Marland Kitchen  metoprolol succinate (TOPROL-XL) 25 MG 24 hr tablet Take 25 mg by mouth daily.    . Multiple Vitamin (MULTIVITAMIN) tablet Take 1 tablet by mouth daily.    . polyethylene glycol (MIRALAX / GLYCOLAX) packet Take 17 g by mouth daily.    . progesterone (PROMETRIUM) 200 MG capsule TAKE 1 CAPSULE BY MOUTH ONCE DAILY 60 capsule 4  . estradiol (ESTRACE) 0.1 MG/GM vaginal cream PLACE 2 GRAMS VAGINALLY DAILY. (Patient not taking: Reported on 03/21/2018) 42.5 g 6  . eszopiclone (LUNESTA) 2 MG TABS tablet Take 2 mg by mouth at bedtime as needed for sleep. Take immediately before bedtime    . gabapentin (NEURONTIN) 100 MG capsule Take 100 mg by mouth 4 (four) times daily. Pt  takes BID    . sodium chloride (OCEAN) 0.65 % SOLN nasal spray Place 1 spray into both nostrils as needed for congestion.     No current facility-administered medications on file prior to visit.     Review of Systems Patient denies any headaches, blurred vision, shortness of breath, chest pain, abdominal pain, problems with bowel movements, urination, or intercourse.  Objective:  BP 138/81   Pulse 86   Wt 147 lb (66.7 kg)   BMI 24.46 kg/m  Physical Exam  General:  Well developed, well nourished, no acute distress. She is alert and oriented x3. Skin:  Warm and dry Neck:  Midline trachea, no thyromegaly or nodules Cardiovascular: Regular rate and rhythm, no murmur heard Lungs:  Effort normal, all lung fields clear to auscultation bilaterally Breasts:  No dominant palpable mass, retraction, or nipple discharge Abdomen:  Soft, non tender, no hepatosplenomegaly or masses Pelvic:  External genitalia is normal in appearance.  The vagina is normal in appearance. The cervix is bulbous, no CMT.  Thin prep pap is not done. Uterus is felt to be normal size, shape, and contour.  No adnexal masses or tenderness noted. Extremities:  No swelling or varicosities noted Psych:  She has a normal mood and affect  Assessment:   Healthy well-woman exam  Plan:   F/U 1 year for AE, or sooner if needed Mammogram ordered  Edwards Mckelvie Suzan Nailer, CNM

## 2018-04-26 ENCOUNTER — Ambulatory Visit
Admission: RE | Admit: 2018-04-26 | Discharge: 2018-04-26 | Disposition: A | Discharge status: Home / Self Care | Payer: No Typology Code available for payment source | Source: Ambulatory Visit | Attending: Obstetrics and Gynecology | Admitting: Obstetrics and Gynecology

## 2018-04-26 DIAGNOSIS — Z1231 Encounter for screening mammogram for malignant neoplasm of breast: Secondary | ICD-10-CM | POA: Diagnosis present

## 2018-08-20 MED FILL — PANTOPRAZOLE SOD DR 40 MG T: 40 | 30 days supply | Qty: 30 | Fill #0

## 2018-08-22 MED FILL — LINZESS 145 MCG CAPSULE: 145 | 30 days supply | Qty: 30 | Fill #0

## 2018-08-22 MED FILL — DICLOFENAC SOD EC 50 MG TAB: 50 | 30 days supply | Qty: 60 | Fill #0

## 2018-08-27 DIAGNOSIS — R12 Heartburn: Secondary | ICD-10-CM | POA: Insufficient documentation

## 2018-08-27 DIAGNOSIS — K5901 Slow transit constipation: Secondary | ICD-10-CM | POA: Insufficient documentation

## 2018-09-17 MED FILL — DICLOFENAC SOD EC 50 MG TAB: 50 | 30 days supply | Qty: 60 | Fill #0 | Status: TO

## 2018-11-25 ENCOUNTER — Other Ambulatory Visit: Payer: Self-pay | Admitting: Obstetrics and Gynecology

## 2019-03-27 ENCOUNTER — Encounter: Payer: No Typology Code available for payment source | Admitting: Obstetrics and Gynecology

## 2019-04-23 ENCOUNTER — Encounter: Payer: Self-pay | Admitting: Certified Nurse Midwife

## 2019-04-23 ENCOUNTER — Other Ambulatory Visit (HOSPITAL_COMMUNITY)
Admission: RE | Admit: 2019-04-23 | Discharge: 2019-04-23 | Disposition: A | Discharge status: Home / Self Care | Payer: No Typology Code available for payment source | Source: Ambulatory Visit | Attending: Certified Nurse Midwife | Admitting: Certified Nurse Midwife

## 2019-04-23 ENCOUNTER — Other Ambulatory Visit: Payer: Self-pay

## 2019-04-23 ENCOUNTER — Ambulatory Visit (INDEPENDENT_AMBULATORY_CARE_PROVIDER_SITE_OTHER): Payer: No Typology Code available for payment source | Admitting: Certified Nurse Midwife

## 2019-04-23 VITALS — BP 130/79 | HR 75 | Ht 65.0 in | Wt 150.1 lb

## 2019-04-23 DIAGNOSIS — N898 Other specified noninflammatory disorders of vagina: Secondary | ICD-10-CM

## 2019-04-23 DIAGNOSIS — Z01419 Encounter for gynecological examination (general) (routine) without abnormal findings: Secondary | ICD-10-CM

## 2019-04-23 DIAGNOSIS — Z1231 Encounter for screening mammogram for malignant neoplasm of breast: Secondary | ICD-10-CM

## 2019-04-23 MED ORDER — PROGESTERONE MICRONIZED 200 MG PO CAPS: 200.0000 mg | ORAL_CAPSULE | Freq: Every day | ORAL | 4 refills | Status: DC

## 2019-04-23 NOTE — Progress Notes (Signed)
GYNECOLOGY ANNUAL PREVENTATIVE CARE ENCOUNTER NOTE  History:     Natalie Wheeler is a 53 y.o. G0P0 female here for a routine annual gynecologic exam.  Current complaints: none.   Denies abnormal vaginal bleeding, discharge, pelvic pain, problems with intercourse or other gynecologic concerns.    Gynecologic History No LMP recorded. (Menstrual status: Perimenopausal). Contraception: none and postmenopausal  Last Pap: 2016 Results were: normal with negative HPV Last mammogram: 04/26/18. Results were: normal  Obstetric History OB History  Gravida Para Term Preterm AB Living  0            SAB TAB Ectopic Multiple Live Births               Past Medical History:  Diagnosis Date  . Anemia   . Cervicitis   . Decreased libido   . Degenerative disc disease, lumbar    L1-L5  . Dyspareunia   . Hypertension   . Keratosis pilaris   . Lupus ([place])   . Lupus ([place])   . Motion sickness    ships, back seat cars  . Raynaud's disease   . Raynaud's syndrome   . Raynauds syndrome   . Sciatica    right side  . Sjogren's disease ([place])   . SVT (supraventricular tachycardia) ([place])    controlled with metoprolol  . URI (upper respiratory infection)    Head cold, started last week. Antibiotic and prenisone taper rx'd on 02/12/17. Improved.  . Vaginal Pap smear, abnormal     Past Surgical History:  Procedure Laterality Date  . BAND HEMORRHOIDECTOMY    . COLONOSCOPY WITH PROPOFOL N/A 08/03/2017   Procedure: COLONOSCOPY WITH PROPOFOL;  Surgeon: Manya Silvas, MD;  Location: [facility] ENDOSCOPY;  Service: Endoscopy;  Laterality: N/A;  . LEEP    . NASAL SEPTOPLASTY W/ TURBINOPLASTY Bilateral 02/21/2017   Procedure: NASAL SEPTOPLASTY WITH  INFERIOR TURBINATE REDUCTION;  Surgeon: Carloyn Manner, MD;  Location: [place];  Service: ENT;  Laterality: Bilateral;  . [place] Bilateral   . TONSILLECTOMY      Current Outpatient Medications on File Prior to Visit  Medication Sig Dispense Refill   . baclofen (LIORESAL) 20 MG tablet Take 20 mg by mouth as needed for muscle spasms.    . calcium-vitamin D (OSCAL WITH D) 250-125 MG-UNIT tablet Take 1 tablet by mouth daily.    . Cyanocobalamin (VITAMIN B-12 PO) Take by mouth daily.    . cycloSPORINE (RESTASIS) 0.05 % ophthalmic emulsion Place 1 drop into both eyes 2 (two) times daily.    . diclofenac (VOLTAREN) 50 MG EC tablet Take 50 mg by mouth 2 (two) times daily.    Marland Kitchen gabapentin (NEURONTIN) 100 MG capsule Take 100 mg by mouth 4 (four) times daily. Pt takes BID    . hydroxychloroquine (PLAQUENIL) 200 MG tablet Take by mouth daily.    . Linaclotide (LINZESS PO) Take by mouth.    . metoprolol succinate (TOPROL-XL) 25 MG 24 hr tablet Take 25 mg by mouth daily.    . Multiple Vitamin (MULTIVITAMIN) tablet Take 1 tablet by mouth daily.    . pantoprazole (PROTONIX) 40 MG tablet Take by mouth.    . polyethylene glycol (MIRALAX / GLYCOLAX) packet Take 17 g by mouth daily.    . progesterone (PROMETRIUM) 200 MG capsule TAKE 1 CAPSULE BY MOUTH ONCE DAILY 60 capsule 4  . ALPRAZolam (XANAX) 0.5 MG tablet Take 1 tablet (0.5 mg total) by mouth 3 (three) times daily as needed for anxiety. (  Patient not taking: Reported on 04/23/2019) 60 tablet 2  . cetirizine (ZYRTEC) 10 MG tablet Take 10 mg by mouth daily.    Marland Kitchen estradiol (ESTRACE) 0.1 MG/GM vaginal cream PLACE 2 GRAMS VAGINALLY DAILY. (Patient not taking: Reported on 03/21/2018) 42.5 g 6  . eszopiclone (LUNESTA) 2 MG TABS tablet Take 2 mg by mouth at bedtime as needed for sleep. Take immediately before bedtime    . MAGNESIUM PO Take by mouth daily.    . sodium chloride (OCEAN) 0.65 % SOLN nasal spray Place 1 spray into both nostrils as needed for congestion.    . Vitamins-Lipotropics (COMPLEX B-100) TBCR Take by mouth.     No current facility-administered medications on file prior to visit.     Allergies  Allergen Reactions  . Floxin [Ofloxacin] Shortness Of Breath  . Buspirone Nausea Only  .  Ciprofloxacin   . Doxycycline   . Mobic [Meloxicam]   . Nifedipine     Hot flashes  . Sulfadiazine   . Tramadol Nausea Only  . Zofran [Ondansetron Hcl]   . Sulfa Antibiotics Rash    Social History:  reports that she quit smoking about 27 years ago. She has never used smokeless tobacco. She reports current alcohol use of about 3.0 standard drinks of alcohol per week. She reports that she does not use drugs.  Family History  Problem Relation Age of Onset  . Breast cancer Paternal Aunt 31  . Diabetes Maternal Grandmother   . Heart disease Maternal Grandfather   . Heart disease Paternal Grandmother   . Cancer Maternal Aunt        ovarian  . Cirrhosis Father     The following portions of the patient's history were reviewed and updated as appropriate: allergies, current medications, past family history, past medical history, past social history, past surgical history and problem list.  Review of Systems Pertinent items noted in HPI and remainder of comprehensive ROS otherwise negative.  Physical Exam:  BP 130/79   Pulse 75   Ht 5\' 5"  (1.651 m)   Wt 150 lb 2 oz (68.1 kg)   BMI 24.98 kg/m  CONSTITUTIONAL: Well-developed, well-nourished female in no acute distress.  HENT:  Normocephalic, atraumatic, External right and left ear normal. Oropharynx is clear and moist EYES: Conjunctivae and EOM are normal. Pupils are equal, round, and reactive to light. No scleral icterus.  NECK: Normal range of motion, supple, no masses.  Normal thyroid.  SKIN: Skin is warm and dry. No rash noted. Not diaphoretic. No erythema. No pallor. MUSCULOSKELETAL: Normal range of motion. No tenderness.  No cyanosis, clubbing, or edema.  2+ distal pulses. NEUROLOGIC: Alert and oriented to person, place, and time. Normal reflexes, muscle tone coordination.  PSYCHIATRIC: Normal mood and affect. Normal behavior. Normal judgment and thought content. CARDIOVASCULAR: Normal heart rate noted, regular  rhythm RESPIRATORY: Clear to auscultation bilaterally. Effort and breath sounds normal, no problems with respiration noted. BREASTS: Symmetric in size. No masses, tenderness, skin changes, nipple drainage, or lymphadenopathy bilaterally. ABDOMEN: Soft, no distention noted.  No tenderness, rebound or guarding.  PELVIC: Normal appearing external genitalia and urethral meatus; normal appearing vaginal mucosa and cervix. Abnormal yellow/green discharge noted. Swab collected. Denies potential for STD- is not sexually active.  Normal uterine size, no other palpable masses, no uterine or adnexal tenderness.   Assessment and Plan:  Annual well women GYN exam  Pap not due Mammogram scheduled  Labs : declines- has done by PCP Refill progesterone  Colonsocpy-08/03/2017 Vaginal  swab collected due to discharge.  Routine preventative health maintenance measures emphasized. Please refer to After Visit Summary for other counseling recommendations.      Doreene BurkeAnnie Tipton Ballow, CNM

## 2019-04-23 NOTE — Patient Instructions (Signed)

## 2019-04-24 ENCOUNTER — Telehealth: Payer: Self-pay

## 2019-04-24 ENCOUNTER — Other Ambulatory Visit: Payer: Self-pay

## 2019-04-24 MED ORDER — PROGESTERONE MICRONIZED 200 MG PO CAPS: 200.0000 mg | ORAL_CAPSULE | Freq: Every day | ORAL | 0 refills | Status: DC

## 2019-04-24 NOTE — Telephone Encounter (Signed)
Patients needs a 90 supply of progesterone per insurance. Last prescription was filled Sept 2020 by [facility]. Please contact patient.

## 2019-04-24 NOTE — Telephone Encounter (Signed)
90 day supply of progesterone sent to preferred pharmacy per message received.

## 2019-04-25 LAB — CERVICOVAGINAL ANCILLARY ONLY
Bacterial Vaginitis (gardnerella): NEGATIVE
Candida Glabrata: NEGATIVE
Candida Vaginitis: NEGATIVE
Comment: NEGATIVE
Comment: NEGATIVE
Comment: NEGATIVE

## 2019-04-28 ENCOUNTER — Other Ambulatory Visit: Payer: Self-pay | Admitting: Certified Nurse Midwife

## 2019-04-28 DIAGNOSIS — Z1231 Encounter for screening mammogram for malignant neoplasm of breast: Secondary | ICD-10-CM

## 2019-05-14 ENCOUNTER — Ambulatory Visit
Admission: RE | Admit: 2019-05-14 | Discharge: 2019-05-14 | Disposition: A | Discharge status: Home / Self Care | Payer: No Typology Code available for payment source | Source: Ambulatory Visit | Attending: Certified Nurse Midwife | Admitting: Certified Nurse Midwife

## 2019-05-14 DIAGNOSIS — Z1231 Encounter for screening mammogram for malignant neoplasm of breast: Secondary | ICD-10-CM | POA: Insufficient documentation

## 2019-06-23 DIAGNOSIS — I471 Supraventricular tachycardia, unspecified: Secondary | ICD-10-CM | POA: Insufficient documentation

## 2019-08-07 ENCOUNTER — Other Ambulatory Visit: Payer: Self-pay | Admitting: Ophthalmology

## 2019-08-08 ENCOUNTER — Other Ambulatory Visit: Payer: Self-pay | Admitting: Ophthalmology

## 2019-08-21 ENCOUNTER — Other Ambulatory Visit: Payer: Self-pay | Admitting: Student

## 2019-12-22 ENCOUNTER — Other Ambulatory Visit: Payer: Self-pay | Admitting: Internal Medicine

## 2019-12-24 ENCOUNTER — Other Ambulatory Visit: Payer: Self-pay | Admitting: Internal Medicine

## 2019-12-29 ENCOUNTER — Other Ambulatory Visit: Payer: Self-pay | Admitting: Internal Medicine

## 2020-04-05 ENCOUNTER — Other Ambulatory Visit: Payer: Self-pay | Admitting: Certified Nurse Midwife

## 2020-04-05 DIAGNOSIS — Z1231 Encounter for screening mammogram for malignant neoplasm of breast: Secondary | ICD-10-CM

## 2020-04-07 ENCOUNTER — Other Ambulatory Visit: Payer: Self-pay | Admitting: Gastroenterology

## 2020-04-27 ENCOUNTER — Encounter: Payer: No Typology Code available for payment source | Admitting: Certified Nurse Midwife

## 2020-04-28 ENCOUNTER — Other Ambulatory Visit: Payer: Self-pay | Admitting: Internal Medicine

## 2020-05-03 ENCOUNTER — Other Ambulatory Visit: Payer: Self-pay

## 2020-05-03 ENCOUNTER — Other Ambulatory Visit: Payer: Self-pay | Admitting: Certified Nurse Midwife

## 2020-05-03 ENCOUNTER — Encounter: Payer: Self-pay | Admitting: Certified Nurse Midwife

## 2020-05-03 ENCOUNTER — Ambulatory Visit (INDEPENDENT_AMBULATORY_CARE_PROVIDER_SITE_OTHER): Payer: No Typology Code available for payment source | Admitting: Certified Nurse Midwife

## 2020-05-03 VITALS — BP 119/68 | HR 72 | Ht 65.0 in | Wt 149.4 lb

## 2020-05-03 DIAGNOSIS — Z124 Encounter for screening for malignant neoplasm of cervix: Secondary | ICD-10-CM

## 2020-05-03 DIAGNOSIS — Z01419 Encounter for gynecological examination (general) (routine) without abnormal findings: Secondary | ICD-10-CM

## 2020-05-03 MED ORDER — PROGESTERONE 200 MG PO CAPS: 200.0000 mg | ORAL_CAPSULE | Freq: Every day | ORAL | 3 refills | Status: DC

## 2020-05-03 NOTE — Patient Instructions (Signed)

## 2020-05-03 NOTE — Progress Notes (Signed)
GYNECOLOGY ANNUAL PREVENTATIVE CARE ENCOUNTER NOTE  History:     Natalie Wheeler is a 54 y.o. G0P0 female here for a routine annual gynecologic exam.  Current complaints: Dyspareunia, has used estrogen cream in the past. Did not like it. Declines treatment at this time .   Denies abnormal vaginal bleeding, discharge, pelvic pain, or other gynecologic concerns.     Social Relationship: yes Living: with her significant other Work: Medical coding  Exercise: yoga and zumba Smoke/Alcohol/drug use: Occasional alcohol use  Gynecologic History No LMP recorded. (Menstrual status: Perimenopausal). Contraception: post menopausal status Last Pap: . Results were: normal with negative HPV Last mammogram: 05/15/19. Results were: normal / Obstetric History OB History  Gravida Para Term Preterm AB Living  0            SAB IAB Ectopic Multiple Live Births               Past Medical History:  Diagnosis Date  . Anemia   . Cervicitis   . Decreased libido   . Degenerative disc disease, lumbar    L1-L5  . Dyspareunia   . Hypertension   . Keratosis pilaris   . Lupus (HCC)   . Lupus (HCC)   . Motion sickness    ships, back seat cars  . Raynaud's disease   . Raynaud's syndrome   . Raynauds syndrome   . Sciatica    right side  . Sjogren's disease (HCC)   . SVT (supraventricular tachycardia) (HCC)    controlled with metoprolol  . URI (upper respiratory infection)    Head cold, started last week. Antibiotic and prenisone taper rx'd on 02/12/17. Improved.  . Vaginal Pap smear, abnormal     Past Surgical History:  Procedure Laterality Date  . BAND HEMORRHOIDECTOMY    . COLONOSCOPY WITH PROPOFOL N/A 08/03/2017   Procedure: COLONOSCOPY WITH PROPOFOL;  Surgeon: Scot Jun, MD;  Location: [facility] ENDOSCOPY;  Service: Endoscopy;  Laterality: N/A;  . LEEP    . NASAL SEPTOPLASTY W/ TURBINOPLASTY Bilateral 02/21/2017   Procedure: NASAL SEPTOPLASTY WITH  INFERIOR TURBINATE REDUCTION;   Surgeon: Bud Face, MD;  Location: [facility];  Service: ENT;  Laterality: Bilateral;  . PRK Bilateral   . TONSILLECTOMY      Current Outpatient Medications on File Prior to Visit  Medication Sig Dispense Refill  . azelastine (ASTELIN) 0.1 % nasal spray Place into both nostrils.    . baclofen (LIORESAL) 20 MG tablet Take 20 mg by mouth as needed for muscle spasms.    . calcium-vitamin D (OSCAL WITH D) 250-125 MG-UNIT tablet Take 1 tablet by mouth daily.    . cyanocobalamin 1000 MCG tablet Take by mouth.    . cycloSPORINE (RESTASIS) 0.05 % ophthalmic emulsion Place 1 drop into both eyes 2 (two) times daily.    . diclofenac (CATAFLAM) 50 MG tablet Take 50 mg by mouth 2 (two) times daily.    . hydroxychloroquine (PLAQUENIL) 200 MG tablet Take by mouth.    . linaclotide (LINZESS) 290 MCG CAPS capsule Take by mouth.    . loratadine (CLARITIN) 10 MG tablet Take 10 mg by mouth daily.    . metoprolol tartrate (LOPRESSOR) 25 MG tablet Take 25 mg by mouth daily.    . Multiple Vitamin (MULTIVITAMIN) tablet Take 1 tablet by mouth daily.    . pantoprazole (PROTONIX) 40 MG tablet Take by mouth.    . progesterone (PROMETRIUM) 200 MG capsule Take 1 capsule (  200 mg total) by mouth daily. 90 capsule 0  . cetirizine (ZYRTEC) 10 MG tablet Take 10 mg by mouth daily. (Patient not taking: Reported on 05/03/2020)     No current facility-administered medications on file prior to visit.    Allergies  Allergen Reactions  . Floxin [Ofloxacin] Shortness Of Breath  . Buspirone Nausea Only  . Ciprofloxacin   . Doxycycline   . Mobic [Meloxicam]   . Nifedipine     Hot flashes  . Sulfadiazine   . Tramadol Nausea Only  . Zofran [Ondansetron Hcl]   . Sulfa Antibiotics Rash    Social History:  reports that she quit smoking about 28 years ago. She has never used smokeless tobacco. She reports current alcohol use of about 3.0 standard drinks of alcohol per week. She reports that she does not use  drugs.  Family History  Problem Relation Age of Onset  . Breast cancer Paternal Aunt 42  . Diabetes Maternal Grandmother   . Heart disease Maternal Grandfather   . Heart disease Paternal Grandmother   . Cancer Maternal Aunt        ovarian  . Cirrhosis Father     The following portions of the patient's history were reviewed and updated as appropriate: allergies, current medications, past family history, past medical history, past social history, past surgical history and problem list.  Review of Systems Pertinent items noted in HPI and remainder of comprehensive ROS otherwise negative.  Physical Exam:  BP 119/68   Pulse 72   Ht 5\' 5"  (1.651 m)   Wt 149 lb 6 oz (67.8 kg)   BMI 24.86 kg/m  CONSTITUTIONAL: Well-developed, well-nourished female in no acute distress.  HENT:  Normocephalic, atraumatic, External right and left ear normal. Oropharynx is clear and moist EYES: Conjunctivae and EOM are normal. Pupils are equal, round, and reactive to light. No scleral icterus.  NECK: Normal range of motion, supple, no masses.  Normal thyroid.  SKIN: Skin is warm and dry. No rash noted. Not diaphoretic. No erythema. No pallor. MUSCULOSKELETAL: Normal range of motion. No tenderness.  No cyanosis, clubbing, or edema.  2+ distal pulses. NEUROLOGIC: Alert and oriented to person, place, and time. Normal reflexes, muscle tone coordination.  PSYCHIATRIC: Normal mood and affect. Normal behavior. Normal judgment and thought content. CARDIOVASCULAR: Normal heart rate noted, regular rhythm RESPIRATORY: Clear to auscultation bilaterally. Effort and breath sounds normal, no problems with respiration noted. BREASTS: Symmetric in size. No masses, tenderness, skin changes, nipple drainage, or lymphadenopathy bilaterally.  ABDOMEN: Soft, no distention noted.  No tenderness, rebound or guarding.  PELVIC: Normal appearing external genitalia and urethral meatus; normal appearing vaginal mucosa and cervix.  No  abnormal discharge noted.  Pap smear obtained.  Normal uterine size, no other palpable masses, no uterine or adnexal tenderness.  .   Assessment and Plan:    1. Women's annual routine gynecological examination    Pap: Will follow up results of pap smear and manage accordingly. Mammogram :has scheduled January Labs: has done with PCP Refills: progesterone Referral:none Routine preventative health maintenance measures emphasized. Please refer to After Visit Summary for other counseling recommendations.      February, CNM [facility],  [facility]

## 2020-05-04 ENCOUNTER — Other Ambulatory Visit (HOSPITAL_COMMUNITY)
Admission: RE | Admit: 2020-05-04 | Discharge: 2020-05-04 | Disposition: A | Discharge status: Home / Self Care | Payer: No Typology Code available for payment source | Source: Ambulatory Visit | Attending: Certified Nurse Midwife | Admitting: Certified Nurse Midwife

## 2020-05-04 ENCOUNTER — Ambulatory Visit (INDEPENDENT_AMBULATORY_CARE_PROVIDER_SITE_OTHER): Payer: No Typology Code available for payment source | Admitting: Vascular Surgery

## 2020-05-04 ENCOUNTER — Encounter (INDEPENDENT_AMBULATORY_CARE_PROVIDER_SITE_OTHER): Payer: Self-pay | Admitting: Vascular Surgery

## 2020-05-04 VITALS — BP 144/91 | HR 70 | Resp 16 | Ht 65.5 in | Wt 148.0 lb

## 2020-05-04 DIAGNOSIS — I10 Essential (primary) hypertension: Secondary | ICD-10-CM

## 2020-05-04 DIAGNOSIS — I7300 Raynaud's syndrome without gangrene: Secondary | ICD-10-CM

## 2020-05-04 DIAGNOSIS — R600 Localized edema: Secondary | ICD-10-CM

## 2020-05-04 DIAGNOSIS — Z124 Encounter for screening for malignant neoplasm of cervix: Secondary | ICD-10-CM | POA: Diagnosis present

## 2020-05-04 DIAGNOSIS — Z01419 Encounter for gynecological examination (general) (routine) without abnormal findings: Secondary | ICD-10-CM | POA: Insufficient documentation

## 2020-05-04 DIAGNOSIS — D649 Anemia, unspecified: Secondary | ICD-10-CM | POA: Insufficient documentation

## 2020-05-04 DIAGNOSIS — M7989 Other specified soft tissue disorders: Secondary | ICD-10-CM | POA: Insufficient documentation

## 2020-05-04 NOTE — Addendum Note (Signed)
Addended by: Mechele Claude on: 05/04/2020 01:30 PM   Modules accepted: Orders

## 2020-05-04 NOTE — Progress Notes (Signed)
Patient ID: Natalie Wheeler, female   DOB: 1966-03-22, 54 y.o.   MRN: [id number]  Chief Complaint  Patient presents with  . New Patient (Initial Visit)    Ref Graciela Husbands lymphedema    HPI Natalie Wheeler is a 54 y.o. female.  I am asked to see the patient by Dr. Graciela Husbands for evaluation of lymphedema.  The patient first noticed swelling about 2 years ago.  This has been a gradually progressive problem since that time.  She denies any open wounds or infection.  No fevers or chills.  No previous history of DVT or superficial thrombophlebitis to her knowledge.  She has her mother died when she was very young, but she has heard people talk about that she had phlebitis.  No other history of venous disorders in her family that she is aware of.  Under the direction of her primary care physician, she has been diligently wearing compression stockings, elevating her legs, and she remains very active and in shape.  She has been doing this for several months.  Despite this, she seems to be having worsening symptoms.  With her symptoms getting worse, she is referred for further evaluation and treatment.     Past Medical History:  Diagnosis Date  . Anemia   . Cervicitis   . Decreased libido   . Degenerative disc disease, lumbar    L1-L5  . Dyspareunia   . Hypertension   . Keratosis pilaris   . Lupus (HCC)   . Lupus (HCC)   . Motion sickness    ships, back seat cars  . Raynaud's disease   . Raynaud's syndrome   . Raynauds syndrome   . Sciatica    right side  . Sjogren's disease (HCC)   . SVT (supraventricular tachycardia) (HCC)    controlled with metoprolol  . URI (upper respiratory infection)    Head cold, started last week. Antibiotic and prenisone taper rx'd on 02/12/17. Improved.  . Vaginal Pap smear, abnormal     Past Surgical History:  Procedure Laterality Date  . BAND HEMORRHOIDECTOMY    . COLONOSCOPY WITH PROPOFOL N/A 08/03/2017   Procedure: COLONOSCOPY WITH PROPOFOL;  Surgeon: Scot Jun, MD;  Location: [facility] ENDOSCOPY;  Service: Endoscopy;  Laterality: N/A;  . LEEP    . NASAL SEPTOPLASTY W/ TURBINOPLASTY Bilateral 02/21/2017   Procedure: NASAL SEPTOPLASTY WITH  INFERIOR TURBINATE REDUCTION;  Surgeon: Bud Face, MD;  Location: [facility];  Service: ENT;  Laterality: Bilateral;  . PRK Bilateral   . TONSILLECTOMY       Family History  Problem Relation Age of Onset  . Breast cancer Paternal Aunt 63  . Diabetes Maternal Grandmother   . Heart disease Maternal Grandfather   . Heart disease Paternal Grandmother   . Cancer Maternal Aunt        ovarian  . Cirrhosis Father      Social History   Tobacco Use  . Smoking status: Former Smoker    Quit date: 1993    Years since quitting: 28.9  . Smokeless tobacco: Never Used  Vaping Use  . Vaping Use: Never used  Substance Use Topics  . Alcohol use: Yes    Alcohol/week: 3.0 standard drinks    Types: 3 Glasses of wine per week    Comment: none last 24hrs  . Drug use: No    Allergies  Allergen Reactions  . Floxin [Ofloxacin] Shortness Of Breath  . Buspirone Nausea Only  . Ciprofloxacin   .  Doxycycline   . Mobic [Meloxicam]   . Nifedipine     Hot flashes  . Sulfadiazine   . Tramadol Nausea Only  . Zofran [Ondansetron Hcl]   . Sulfa Antibiotics Rash    Current Outpatient Medications  Medication Sig Dispense Refill  . azelastine (ASTELIN) 0.1 % nasal spray Place into both nostrils.    . baclofen (LIORESAL) 20 MG tablet Take 20 mg by mouth as needed for muscle spasms.    . calcium-vitamin D (OSCAL WITH D) 250-125 MG-UNIT tablet Take 1 tablet by mouth daily.    . cyanocobalamin 1000 MCG tablet Take by mouth.    . cycloSPORINE (RESTASIS) 0.05 % ophthalmic emulsion Place 1 drop into both eyes 2 (two) times daily.    . diclofenac (CATAFLAM) 50 MG tablet Take 50 mg by mouth 2 (two) times daily.    . hydroxychloroquine (PLAQUENIL) 200 MG tablet Take by mouth.    . linaclotide (LINZESS) 290  MCG CAPS capsule Take by mouth.    . loratadine (CLARITIN) 10 MG tablet Take 10 mg by mouth daily.    . metoprolol tartrate (LOPRESSOR) 25 MG tablet Take 25 mg by mouth daily.    . Multiple Vitamin (MULTIVITAMIN) tablet Take 1 tablet by mouth daily.    . pantoprazole (PROTONIX) 40 MG tablet Take by mouth.    . progesterone (PROMETRIUM) 200 MG capsule Take 1 capsule (200 mg total) by mouth daily. 90 capsule 0  . progesterone (PROMETRIUM) 200 MG capsule Take 1 capsule (200 mg total) by mouth at bedtime. 90 capsule 3  . cetirizine (ZYRTEC) 10 MG tablet Take 10 mg by mouth daily. (Patient not taking: No sig reported)     No current facility-administered medications for this visit.      REVIEW OF SYSTEMS (Negative unless checked)  Constitutional: [] Weight loss  [] Fever  [] Chills Cardiac: [] Chest pain   [] Chest pressure   [] Palpitations   [] Shortness of breath when laying flat   [] Shortness of breath at rest   [] Shortness of breath with exertion. Vascular:  [x] Pain in legs with walking   [] Pain in legs at rest   [] Pain in legs when laying flat   [] Claudication   [] Pain in feet when walking  [] Pain in feet at rest  [] Pain in feet when laying flat   [] History of DVT   [] Phlebitis   [x] Swelling in legs   [] Varicose veins   [] Non-healing ulcers Pulmonary:   [] Uses home oxygen   [] Productive cough   [] Hemoptysis   [] Wheeze  [] COPD   [] Asthma Neurologic:  [] Dizziness  [] Blackouts   [] Seizures   [] History of stroke   [] History of TIA  [] Aphasia   [] Temporary blindness   [] Dysphagia   [] Weakness or numbness in arms   [] Weakness or numbness in legs Musculoskeletal:  [x] Arthritis   [] Joint swelling   [] Joint pain   [x] Low back pain Hematologic:  [] Easy bruising  [] Easy bleeding   [] Hypercoagulable state   [x] Anemic  [] Hepatitis Gastrointestinal:  [] Blood in stool   [] Vomiting blood  [] Gastroesophageal reflux/heartburn   [] Abdominal pain Genitourinary:  [] Chronic kidney disease   [] Difficult urination   [] Frequent urination  [] Burning with urination   [] Hematuria Skin:  [] Rashes   [] Ulcers   [] Wounds Psychological:  [] History of anxiety   []  History of major depression.    Physical Exam BP (!) 144/91 (BP Location: Right Arm)   Pulse 70   Resp 16   Ht 5' 5.5" (1.664 m)   Wt 148 lb (67.1  kg)   BMI 24.25 kg/m  Gen:  WD/WN, NAD. Appears younger than stated age. Head: [place]/AT, No temporalis wasting.  Ear/Nose/Throat: Hearing grossly intact, nares w/o erythema or drainage, oropharynx w/o Erythema/Exudate Eyes: Conjunctiva clear, sclera non-icteric  Neck: trachea midline.  No JVD.  Pulmonary:  Good air movement, respirations not labored, no use of accessory muscles  Cardiac: RRR, no JVD Vascular:  Vessel Right Left  Radial Palpable Palpable                          DP 2+ 2+  PT 1+ 1+   Gastrointestinal:. No masses, surgical incisions, or scars. Musculoskeletal: M/S 5/5 throughout.  Extremities without ischemic changes.  No deformity or atrophy. 1+ BLE edema. Neurologic: Sensation grossly intact in extremities.  Symmetrical.  Speech is fluent. Motor exam as listed above. Psychiatric: Judgment intact, Mood & affect appropriate for pt's clinical situation. Dermatologic: No rashes or ulcers noted.  No cellulitis or open wounds.    Radiology No results found.  Labs No results found for this or any previous visit (from the past 2160 hour(s)).  Assessment/Plan:  Swelling of limb I have had a long discussion with the patient regarding swelling and why it  causes symptoms.  Patient will begin wearing graduated compression stockings class 1 (20-30 mmHg) on a daily basis a prescription was given. The patient will  beginning wearing the stockings first thing in the morning and removing them in the evening. The patient is instructed specifically not to sleep in the stockings.   In addition, behavioral modification will be initiated.  This will include frequent elevation, use of over  the counter pain medications and exercise such as walking.  I have reviewed systemic causes for chronic edema such as liver, kidney and cardiac etiologies.  The patient denies problems with these organ systems.    Consideration for a lymph pump will also be made based upon the effectiveness of conservative therapy.  This would help to improve the edema control and prevent sequela such as ulcers and infections   Patient should undergo duplex ultrasound of the venous system to ensure that DVT or reflux is not present.  The patient will follow-up with me after the ultrasound.    Hypertension blood pressure control important in reducing the progression of atherosclerotic disease. On appropriate oral medications.   Raynaud disease Bothersome but not debilitating      Festus Barren 05/04/2020, 10:45 AM   This note was created with Dragon medical transcription system.  Any errors from dictation are unintentional.

## 2020-05-04 NOTE — Assessment & Plan Note (Signed)

## 2020-05-04 NOTE — Assessment & Plan Note (Signed)
blood pressure control important in reducing the progression of atherosclerotic disease. On appropriate oral medications.  

## 2020-05-04 NOTE — Assessment & Plan Note (Signed)
Bothersome but not debilitating

## 2020-05-05 ENCOUNTER — Other Ambulatory Visit: Payer: Self-pay

## 2020-05-05 ENCOUNTER — Ambulatory Visit (INDEPENDENT_AMBULATORY_CARE_PROVIDER_SITE_OTHER): Payer: No Typology Code available for payment source | Admitting: Nurse Practitioner

## 2020-05-05 ENCOUNTER — Ambulatory Visit (INDEPENDENT_AMBULATORY_CARE_PROVIDER_SITE_OTHER): Payer: No Typology Code available for payment source

## 2020-05-05 ENCOUNTER — Encounter (INDEPENDENT_AMBULATORY_CARE_PROVIDER_SITE_OTHER): Payer: Self-pay | Admitting: Nurse Practitioner

## 2020-05-05 VITALS — BP 145/81 | HR 70 | Resp 16 | Wt 148.0 lb

## 2020-05-05 DIAGNOSIS — I10 Essential (primary) hypertension: Secondary | ICD-10-CM

## 2020-05-05 DIAGNOSIS — I890 Lymphedema, not elsewhere classified: Secondary | ICD-10-CM | POA: Diagnosis not present

## 2020-05-05 DIAGNOSIS — M7989 Other specified soft tissue disorders: Secondary | ICD-10-CM

## 2020-05-05 DIAGNOSIS — R600 Localized edema: Secondary | ICD-10-CM | POA: Diagnosis not present

## 2020-05-11 LAB — CYTOLOGY - PAP
Comment: NEGATIVE
Diagnosis: NEGATIVE
High risk HPV: NEGATIVE

## 2020-05-17 ENCOUNTER — Other Ambulatory Visit: Payer: Self-pay

## 2020-05-17 ENCOUNTER — Ambulatory Visit
Admission: RE | Admit: 2020-05-17 | Discharge: 2020-05-17 | Disposition: A | Discharge status: Home / Self Care | Payer: No Typology Code available for payment source | Source: Ambulatory Visit | Attending: Certified Nurse Midwife | Admitting: Certified Nurse Midwife

## 2020-05-17 DIAGNOSIS — Z1231 Encounter for screening mammogram for malignant neoplasm of breast: Secondary | ICD-10-CM | POA: Insufficient documentation

## 2020-05-21 ENCOUNTER — Telehealth: Payer: Self-pay

## 2020-05-21 ENCOUNTER — Other Ambulatory Visit: Payer: Self-pay | Admitting: Certified Nurse Midwife

## 2020-05-21 ENCOUNTER — Other Ambulatory Visit: Payer: Self-pay

## 2020-05-21 MED ORDER — ESTRADIOL 0.1 MG/GM VA CREA: 1.0000 | TOPICAL_CREAM | Freq: Every day | VAGINAL | 12 refills | Status: DC

## 2020-05-21 NOTE — Telephone Encounter (Signed)
mychart message sent to patient-re name of vaginal cream

## 2020-05-21 NOTE — Telephone Encounter (Signed)
Pt  Called in and stated that at her last appt with Pattricia Boss she didn't want to use the vaginal cream for menopause. The pt is requesting a prescription sent to [facility]. The pt said that she wants to use it even though she said she didn't when she saw Pattricia Boss. Please advise

## 2020-05-21 NOTE — Telephone Encounter (Signed)
Per patient- estradiol vaginal cream 0.1 mg/gm 2 gms vaginally at bedtime. Script sent to preferred pharmacy

## 2020-05-23 ENCOUNTER — Encounter (INDEPENDENT_AMBULATORY_CARE_PROVIDER_SITE_OTHER): Payer: Self-pay | Admitting: Nurse Practitioner

## 2020-05-23 NOTE — Progress Notes (Signed)
Subjective:    Patient ID: Natalie Wheeler, female    DOB: Mar 21, 1966, 55 y.o.   MRN: [id number] Chief Complaint  Patient presents with  . Follow-up    Ultrasound follow up    Natalie Wheeler is a 55 y.o. female.  The patient presents today for follow-up evaluation of lower extremity lymphedema.  This has been ongoing for 2 years.  In that timeframe the patient has been using compression and elevation, but despite the use of conservative measures she continues to have issues with lymphedema.  Her mother passed when she was very young however she does remember people discussing her having phlebitis as well as issues of swelling.  The patient is also very active as well.  Despite all of these known conservative measures she continues to have worsening edema.  Noninvasive studies show no evidence of DVT or superficial venous thrombosis in the bilateral lower extremities.  No evidence of superficial venous reflux or deep venous insufficiency seen bilaterally.  Limited arterial duplex shows normal triphasic waveforms in the distal tibial arteries     Review of Systems  Cardiovascular: Positive for leg swelling.  All other systems reviewed and are negative.      Objective:   Physical Exam Vitals reviewed.  HENT:     Head: Normocephalic.  Cardiovascular:     Rate and Rhythm: Normal rate and regular rhythm.     Pulses: Normal pulses.  Pulmonary:     Effort: Pulmonary effort is normal.  Musculoskeletal:     Right lower leg: 2+ Edema present.     Left lower leg: 2+ Edema present.     Comments: Dermal thickening bilaterally  Skin:    General: Skin is warm and dry.  Neurological:     Mental Status: She is alert and oriented to person, place, and time.  Psychiatric:        Mood and Affect: Mood normal.        Behavior: Behavior normal.        Thought Content: Thought content normal.        Judgment: Judgment normal.     BP (!) 145/81 (BP Location: Right Arm)   Pulse 70   Resp 16    Wt 148 lb (67.1 kg)   BMI 24.25 kg/m   Past Medical History:  Diagnosis Date  . Anemia   . Cervicitis   . Decreased libido   . Degenerative disc disease, lumbar    L1-L5  . Dyspareunia   . Hypertension   . Keratosis pilaris   . Lupus (HCC)   . Lupus (HCC)   . Motion sickness    ships, back seat cars  . Raynaud's disease   . Raynaud's syndrome   . Raynauds syndrome   . Sciatica    right side  . Sjogren's disease (HCC)   . SVT (supraventricular tachycardia) (HCC)    controlled with metoprolol  . URI (upper respiratory infection)    Head cold, started last week. Antibiotic and prenisone taper rx'd on 02/12/17. Improved.  . Vaginal Pap smear, abnormal     Social History   Socioeconomic History  . Marital status: Single    Spouse name: Not on file  . Number of children: Not on file  . Years of education: Not on file  . Highest education level: Not on file  Occupational History  . Not on file  Tobacco Use  . Smoking status: Former Smoker    Quit date: 1993  Years since quitting: 29.0  . Smokeless tobacco: Never Used  Vaping Use  . Vaping Use: Never used  Substance and Sexual Activity  . Alcohol use: Yes    Alcohol/week: 3.0 standard drinks    Types: 3 Glasses of wine per week    Comment: none last 24hrs  . Drug use: No  . Sexual activity: Not Currently    Birth control/protection: None  Other Topics Concern  . Not on file  Social History Narrative  . Not on file   Social Determinants of Health   Financial Resource Strain: Not on file  Food Insecurity: Not on file  Transportation Needs: Not on file  Physical Activity: Not on file  Stress: Not on file  Social Connections: Not on file  Intimate Partner Violence: Not on file    Past Surgical History:  Procedure Laterality Date  . BAND HEMORRHOIDECTOMY    . COLONOSCOPY WITH PROPOFOL N/A 08/03/2017   Procedure: COLONOSCOPY WITH PROPOFOL;  Surgeon: Scot Jun, MD;  Location: [facility] ENDOSCOPY;   Service: Endoscopy;  Laterality: N/A;  . LEEP    . NASAL SEPTOPLASTY W/ TURBINOPLASTY Bilateral 02/21/2017   Procedure: NASAL SEPTOPLASTY WITH  INFERIOR TURBINATE REDUCTION;  Surgeon: Bud Face, MD;  Location: [facility];  Service: ENT;  Laterality: Bilateral;  . PRK Bilateral   . TONSILLECTOMY      Family History  Problem Relation Age of Onset  . Breast cancer Paternal Aunt 22  . Diabetes Maternal Grandmother   . Heart disease Maternal Grandfather   . Heart disease Paternal Grandmother   . Cancer Maternal Aunt        ovarian  . Cirrhosis Father     Allergies  Allergen Reactions  . Floxin [Ofloxacin] Shortness Of Breath  . Buspirone Nausea Only  . Ciprofloxacin   . Doxycycline   . Mobic [Meloxicam]   . Nifedipine     Hot flashes  . Sulfadiazine   . Tramadol Nausea Only  . Zofran [Ondansetron Hcl]   . Sulfa Antibiotics Rash    No flowsheet data found.    CMP     Component Value Date/Time   NA 140 02/23/2016 1606   K 3.8 02/23/2016 1606   CL 97 02/23/2016 1606   CO2 22 02/23/2016 1606   GLUCOSE 77 02/23/2016 1606   BUN 18 02/23/2016 1606   CREATININE 0.83 02/23/2016 1606   CREATININE 0.97 11/17/2011 1020   CALCIUM 9.2 02/23/2016 1606   PROT 7.2 02/23/2016 1606   ALBUMIN 4.6 02/23/2016 1606   AST 23 02/23/2016 1606   ALT 17 02/23/2016 1606   ALKPHOS 65 02/23/2016 1606   BILITOT 0.5 02/23/2016 1606   GFRNONAA 82 02/23/2016 1606   GFRNONAA >60 11/17/2011 1020   GFRAA 95 02/23/2016 1606   GFRAA >60 11/17/2011 1020     No results found.     Assessment & Plan:   1. Lymphedema Recommend:  No surgery or intervention at this point in time.    I have reviewed my previous discussion with the patient regarding swelling and why it causes symptoms.  Patient will continue wearing graduated compression stockings class 1 (20-30 mmHg) on a daily basis. The patient will  beginning wearing the stockings first thing in the morning and removing them  in the evening. The patient is instructed specifically not to sleep in the stockings.    In addition, behavioral modification including several periods of elevation of the lower extremities during the day will be continued.  This was reviewed with the patient during the initial visit.  The patient will also continue routine exercise, especially walking on a daily basis as was discussed during the initial visit.    Despite conservative treatments at least 4 weeks including graduated compression therapy class 1 and behavioral modification including exercise and elevation the patient  has not obtained adequate control of the lymphedema.  The patient still has stage 3 lymphedema and therefore, I believe that a lymph pump should be added to improve the control of the patient's lymphedema.  Additionally, a lymph pump is warranted because it will reduce the risk of cellulitis and ulceration in the future.  Patient should follow-up in six months    2. Primary hypertension Continue antihypertensive medications as already ordered, these medications have been reviewed and there are no changes at this time.    Current Outpatient Medications on File Prior to Visit  Medication Sig Dispense Refill  . azelastine (ASTELIN) 0.1 % nasal spray Place into both nostrils.    . baclofen (LIORESAL) 20 MG tablet Take 20 mg by mouth as needed for muscle spasms.    . calcium-vitamin D (OSCAL WITH D) 250-125 MG-UNIT tablet Take 1 tablet by mouth daily.    . cyanocobalamin 1000 MCG tablet Take by mouth.    . cycloSPORINE (RESTASIS) 0.05 % ophthalmic emulsion Place 1 drop into both eyes 2 (two) times daily.    . diclofenac (CATAFLAM) 50 MG tablet Take 50 mg by mouth 2 (two) times daily.    . hydroxychloroquine (PLAQUENIL) 200 MG tablet Take by mouth.    . linaclotide (LINZESS) 290 MCG CAPS capsule Take by mouth.    . loratadine (CLARITIN) 10 MG tablet Take 10 mg by mouth daily.    . metoprolol tartrate (LOPRESSOR) 25 MG  tablet Take 25 mg by mouth daily.    . Multiple Vitamin (MULTIVITAMIN) tablet Take 1 tablet by mouth daily.    . pantoprazole (PROTONIX) 40 MG tablet Take by mouth.    . progesterone (PROMETRIUM) 200 MG capsule Take 1 capsule (200 mg total) by mouth daily. 90 capsule 0  . progesterone (PROMETRIUM) 200 MG capsule Take 1 capsule (200 mg total) by mouth at bedtime. 90 capsule 3  . cetirizine (ZYRTEC) 10 MG tablet Take 10 mg by mouth daily. (Patient not taking: No sig reported)     No current facility-administered medications on file prior to visit.    There are no Patient Instructions on file for this visit. No follow-ups on file.   Georgiana Spinner, NP

## 2020-06-21 ENCOUNTER — Other Ambulatory Visit: Payer: Self-pay | Admitting: Internal Medicine

## 2020-08-16 ENCOUNTER — Other Ambulatory Visit: Payer: Self-pay

## 2020-08-16 MED ORDER — LINZESS 290 MCG PO CAPS
ORAL_CAPSULE | ORAL | 3 refills | Status: DC
  Filled 2020-08-16: qty 90, 90d supply, fill #0
  Filled 2020-11-25: qty 90, 90d supply, fill #1
  Filled 2021-03-24: qty 90, 90d supply, fill #2
  Filled 2021-06-27: qty 90, 90d supply, fill #3

## 2020-08-17 ENCOUNTER — Other Ambulatory Visit: Payer: Self-pay

## 2020-08-19 ENCOUNTER — Encounter: Payer: Self-pay | Admitting: Occupational Therapy

## 2020-08-19 ENCOUNTER — Ambulatory Visit: Payer: No Typology Code available for payment source | Attending: Internal Medicine | Admitting: Occupational Therapy

## 2020-08-19 DIAGNOSIS — I890 Lymphedema, not elsewhere classified: Secondary | ICD-10-CM | POA: Insufficient documentation

## 2020-08-19 NOTE — Patient Instructions (Signed)

## 2020-08-20 NOTE — Therapy (Signed)
[facility] MAIN [facility] SERVICES [address] [place], [place], [address] Phone: [phone]   Fax:  [phone]  Patient Details  Name: Natalie Wheeler MRN: [id number] Date of Birth: Aug 01, 1965 Referring Provider:  Lynnea Ferrier, MD  Encounter Date: 08/19/2020  Note started in error. Pt No showed.    Loel Dubonnet, MS, OTR/L, [facility] 08/20/20 10:11 AM  [facility] MAIN [facility] SERVICES [address] [place], [place], [address] Phone: [phone]   Fax:  [phone]

## 2020-08-23 ENCOUNTER — Encounter: Payer: No Typology Code available for payment source | Admitting: Occupational Therapy

## 2020-08-24 ENCOUNTER — Ambulatory Visit: Payer: No Typology Code available for payment source | Admitting: Occupational Therapy

## 2020-08-24 ENCOUNTER — Other Ambulatory Visit: Payer: Self-pay

## 2020-08-24 ENCOUNTER — Encounter: Payer: Self-pay | Admitting: Occupational Therapy

## 2020-08-24 DIAGNOSIS — I890 Lymphedema, not elsewhere classified: Secondary | ICD-10-CM | POA: Diagnosis present

## 2020-08-25 ENCOUNTER — Encounter: Payer: No Typology Code available for payment source | Admitting: Occupational Therapy

## 2020-08-25 ENCOUNTER — Encounter: Payer: Self-pay | Admitting: Occupational Therapy

## 2020-08-25 NOTE — Therapy (Signed)
Natalie Wheeler MAIN [facility] SERVICES [address] [place], [place], [address] Phone: [phone]   Fax:  [phone]  Occupational Therapy Evaluation  Patient Details  Name: Natalie Wheeler MRN: [id number] Date of Birth: 05-Aug-1965 Referring Provider (OT): Daniel Nones, MD   Encounter Date: 08/24/2020   OT End of Session - 08/25/20 0856    Visit Number 1    Number of Visits 36    Date for OT Re-Evaluation 11/23/20    Authorization Type [place]    OT Start Time 0310    OT Stop Time 0420    OT Time Calculation (min) 70 min    Activity Tolerance Patient tolerated treatment well;No increased pain    Behavior During Therapy WFL for tasks assessed/performed           Past Medical History:  Diagnosis Date  . Anemia   . Cervicitis   . Decreased libido   . Degenerative disc disease, lumbar    L1-L5  . Dyspareunia   . Hypertension   . Keratosis pilaris   . Lupus (HCC)   . Lupus (HCC)   . Motion sickness    ships, back seat cars  . Raynaud's disease   . Raynaud's syndrome   . Raynauds syndrome   . Sciatica    right side  . Sjogren's disease (HCC)   . SVT (supraventricular tachycardia) (HCC)    controlled with metoprolol  . URI (upper respiratory infection)    Head cold, started last week. Antibiotic and prenisone taper rx'd on 02/12/17. Improved.  . Vaginal Pap smear, abnormal     Past Surgical History:  Procedure Laterality Date  . BAND HEMORRHOIDECTOMY    . COLONOSCOPY WITH PROPOFOL N/A 08/03/2017   Procedure: COLONOSCOPY WITH PROPOFOL;  Surgeon: Scot Jun, MD;  Location: [facility] ENDOSCOPY;  Service: Endoscopy;  Laterality: N/A;  . LEEP    . NASAL SEPTOPLASTY W/ TURBINOPLASTY Bilateral 02/21/2017   Procedure: NASAL SEPTOPLASTY WITH  INFERIOR TURBINATE REDUCTION;  Surgeon: Bud Face, MD;  Location: [facility];  Service: ENT;  Laterality: Bilateral;  . PRK Bilateral   . TONSILLECTOMY      There were no vitals filed  for this visit.   Subjective Assessment - 08/24/20 1515    Subjective  Natalie Wheeler is a 55 yo female referred to Occupational Therapy by [name], MD for evaluation and treatment of BLE lymphedema. Pt reprts an episode of leg swelling below the knees in conjunction with leg rash at age 89 yo. Eventually leg swelling and rash resolved.  At age 52, at about te same time she was diagnoses with Systemic Lupus,  Pt reports onset of chronic leg swelling, primarily below the knees, that persists today.  Leg swelling does reportedly fluctuate , but it does not resolve with elevation over night. [name]  has not previously undergone Complete Decongestive Therapy(CDT)  for lymphedema care. She wears off-the-shelf, ccl 1 (20-30 mmHg) compression knee highs daily as directed. Recent Dopler ruled out DVT and venous insufficiency. Pt reports her mother may have had some leg swelling due to circulatory issues, but family hx of chronic limb swelling is unknown to her. Pt denies hx of cellulitis or leg wounds. She leads an active lifestyle and elevates legs when possible. Pt's goals for OT are to learn how to massage my legs myself and know how to take care of this.    Pertinent History relevant to lymphedema (LE) systemic lupus, htn, Raynaud's  Syndrome, hx  Keritosis piaris, DJD L1-L5, supraventricular tachycardia    Limitations Leg swelling and associated  pain limits ability to fit preferred street shoes, ability to perform exercise for health, limits abilty to perform work activities and some leisure pursuits, limits social participation, limits body image (clothing selection)    Repetition Increases Symptoms    Special Tests +Stemmer    Currently in Pain? Yes    Pain Score 5     Pain Location Leg    Pain Orientation Right;Left    Pain Descriptors / Indicators Tightness;Aching;Tender;Burning;Pressure;Heaviness;Tiring;Sore;Discomfort;Squeezing    Pain Type Chronic pain    Pain Onset More than a month ago     Pain Frequency Intermittent    Aggravating Factors  extended sitting in dependent position, standing > 10 minutes,    Pain Relieving Factors walking, rubbing,             [facility] OT Assessment - 08/25/20 0001      Assessment   Medical Diagnosis BLE stage II lymphedema 2/2    Referring Provider (OT) Daniel Nones, MD    Hand Dominance Right    Prior Therapy none      Balance Screen   Has the patient fallen in the past 6 months No    Has the patient had a decrease in activity level because of a fear of falling?  No    Is the patient reluctant to leave their home because of a fear of falling?  No      IADL   Prior Level of Function Shopping I    Shopping Takes care of all shopping needs independently    Prior Level of Function Light Housekeeping I    Light Housekeeping Maintains house alone or with occasional assistance    Prior Level of Function Meal Prep I    Meal Prep Plans, prepares and serves adequate meals independently    Prior Level of Function Community Mobility I    Training and development officer own Geophysical data processor Status Independent      Activity Tolerance   Activity Tolerance Endurance does not limit participation in activity      Cognition   Overall Cognitive Status Within Functional Limits for tasks assessed      Observation/Other Assessments   Focus on Therapeutic Outcomes (FOTO)  87/100   Patient's intake functional measure is 87 on a scale of 0 - 100 (higher number = greater function     Posture/Postural Control   Posture/Postural Control No significant limitations      Sensation   Light Touch Appears Intact      Coordination   Gross Motor Movements are Fluid and Coordinated Yes      ROM / Strength   AROM / PROM / Strength AROM;Strength      AROM   Overall AROM  Within functional limits for tasks performed    Overall AROM Comments w lupus pain      Strength   Overall Strength Within functional limits for tasks performed    Overall  Strength Comments w lupus pain      Hand Function   Right Hand Gross Grasp Functional    Left Hand Gross Grasp Functional                    OT Treatments/Exercises (OP) - 08/25/20 0001      Transfers   Transfers Sit to Stand    Sit to Stand With armrests;With  upper extremity assist      ADLs   Overall ADLs requires seated rest breaks when performing I/ADLs requiring standing and/ or walking > 10 minutes as this exacerbated LE-related pain and swelling in legs    LB Dressing difficulty fitting shoes and LB clothing 2/2 swelihng    ADL Education Given Yes      Manual Therapy   Manual Therapy Edema management           Mild Stage II  BLE/ BLQ Lymphedema  2/2 systemic lupus and chronic inflammation  Skin  Description Hyper-Keratosis Peau' de Orange Shiny Tight Fibrotic/ Indurated Fatty Doughy spongy      x Mild at distal legs and ankles        Skin dry Flaky WNL Macerated   mild      Color Redness Present Pallor Blanching Hemosiderin Staining Mottled       x    Odor Malodorous Yeast Fungal infection  Absent      x   Temperature Warm Cool wnl      x   Pitting Edema   1+ 2+ 3+ 4+ Non-pitting        x   Girth Symmetrical Asymmetrical                   Distribution      Feet to tibial tuberosities     Stemmer Sign Positive Negative   + base of toes    Lymphorrhea History Of:  Present Absent     x    Wounds History Of Present Absent Venous Arterial Pressure Mechanical             Signs of Infection Redness Warmth Erythema Acute Swelling Drainage Borders                    Sensation Light Touch Deep pressure Hypersensitivty   Present Impaired Present Impaired Absent Impaired   x  x  x     Nails WNL  x Fungus nail dystrophy        Hair Growth Symmetrical Asymmetrical   x    Skin Creases Base of toes  Ankles   Base of Fingers Medial Thighs         Abdominal pannus Lobules  Face/neck                    OT Education -  08/25/20 0856    Education Details Provided Pt education regarding lymphatic structure and function, etiologies, onset patterns and stages of progression. Discussed  impact of obesity on lymphatic function. Outlined Complete Decongestive Therapy (CDT)  as standard of care and provided in depth information regarding 4 primary components of both Intensive and Self Management Phases, including Manual Lymph Drainage (MLD), compression wrapping and garments, skin care, and therapeutic exercise.   Homero Fellers discussion of high burden of care and contributing impact of existing co morbidities. We discussed  Importance of daily, ongoing LE self-care essential to retaining clinical gains and limiting progression.  Also discussed recent ersearch related to lymphedema and Lupus.    Person(s) Educated Patient    Methods Explanation;Demonstration;Handout    Comprehension Verbalized understanding;Returned demonstration;Need further instruction               OT Long Term Goals - 08/24/20 1704      OT LONG TERM GOAL #1   Title Pt will be able to apply knee length, multi-layer, short stretch compression wraps using correct gradient techniques  with extra time (modified independence) to return affected limb to premorbid size and shape, to limit leg pain and infection risk, and to improve safe functional ambulation and mobility.    Baseline Max A    Time 4    Period Days    Status New      OT LONG TERM GOAL #2   Title Patient will demonstrate understanding of LE precautions, prevention strategies and cellulitis signs and symptoms by verbalizing at least 4 examples using printed reference with modified assistance to limit infection risk, recurrent wounds and LE progression.    Baseline Max A    Time 4    Period Days    Status New      OT LONG TERM GOAL #3   Title Pt will achieve at least a 5%  limb volume reduction  in both legs during Intensive Phase CDT to prevent re-accumulation of lymphatic congestion and  progression of fibrosis, to limit infection risk, to improve functional ambulation and transfers, and  to improve functional performance of basic and instrumental ADLs, and to limit LE progression.    Baseline Max A    Time 12    Period Weeks    Status New    Target Date 11/22/20      OT LONG TERM GOAL #4   Title Pt will achieve and sustain a least 85% compliance performing all daily LE self-care home program components throughout Intensive Phase CDT, including recommended skin care regime, lymphatic pumping ther ex, 23/7 compression wraps and simple self-MLD, to ensure optimal limb volume reduction, to limit infection risk and to limit LE progression.    Baseline Max A    Time 12    Period Weeks    Status New    Target Date 11/22/20      OT LONG TERM GOAL #5   Title By DC from OT Pt will be able to don and doff appropriate daytime compression garments and HOS devices with Max CG assistance to limit lymphatic re-accumulation and LE progression with before transitioning to self-management phase of CDT.    Baseline Max A    Time 12    Period Weeks    Status New    Target Date 11/23/20      Long Term Additional Goals   Additional Long Term Goals Yes      OT LONG TERM GOAL #6   Title Pt will report decrease in lymphedema related leg pain/discomfort from 6/10 to 3/10 (50%) by OT DC to improve functional performance in all occupational domains and to improve quality of life.    Baseline Mod A    Time 12    Period Weeks    Status New    Target Date 11/22/20                 Plan - 08/25/20 0858    Clinical Impression Statement Antoinette R Vanhecke is a 55 yo female presenting with mild, Stage II BLE lymphedema 2/2 chronic systemic lupus and the chronmic inflamatory response associated with autoimune disease. Venous insufficiency and DVT as underlying causes are ruled out, as is obesity. Lymphedema is concentrated primarily below the knees and extends to base of toes. By palpation  congested tissue is mildly fibrotic, indicating LE progression over time as protien becomes more dense. 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 Pt will benefit from skilled Occupational Therapy for Intensive and Management Phase Complete Decongestive Therapy (CDT).  CDT will include manual lymphatic drainage (MLD), skin care, therapeutic exercise and compression therapy to reduce limb swelling and associated pain, to limit progression of the condition and to decrease infection risk. ). In keeping with LE precautions, and to limit falls risk, Complete Decongestive Therapy (CDT) is provided to a single limb at a time. In this case we will commence Intensive Phase CDT to the LLE first. Once the LLE is fitted with appropriate compression garments/ devices, we'll commence CDT to the R. Without skilled OT for CDT LE will progress and Ms. Katrinka BlazingSmith will experience further functional decline.    OT Occupational Profile and History Comprehensive Assessment- Review of records and extensive additional review of physical, cognitive, psychosocial history related to current functional performance    Occupational performance deficits (Please refer to evaluation for details): ADL's;IADL's;Work;Leisure;Social Participation;Other   body image   Body Structure / Function / Physical Skills ADL;Decreased knowledge of use of DME;Pain;Edema;IADL;Decreased knowledge of precautions;Skin integrity    Rehab Potential Good    Clinical Decision Making Several treatment options, min-mod task modification necessary     Comorbidities Affecting Occupational Performance: Presence of comorbidities impacting occupational performance    Comorbidities impacting occupational performance description: SEE SUBJECTIVE    Modification or Assistance to Complete Evaluation  Min-Moderate modification of tasks or assist with assess necessary to complete eval    OT Frequency 1x / week   36 weeks   OT Duration Other (comment)    OT Treatment/Interventions Self-care/ADL training;DME and/or AE instruction;Manual lymph drainage;Compression bandaging;Therapeutic activities;Coping strategies training;Therapeutic exercise;Other (comment);Manual Therapy;Patient/family education   fit with appropriate compression garments and devices; coordinate trial with Advanced sequential pneumatic compression device ("pump" - Flexitouch   Plan Complete Decongestive Therapy (CDT): MLD, compression wraps and/ or garments and devices, skin care, ther ex    Consulted and Agree with Plan of Care Patient           Patient will benefit from skilled therapeutic intervention in order to improve the following deficits and impairments:   Body Structure / Function / Physical Skills: ADL,Decreased knowledge of use of DME,Pain,Edema,IADL,Decreased knowledge of precautions,Skin integrity       Visit Diagnosis: Lymphedema, not elsewhere classified - Plan: Ot plan of care cert/re-cert    Problem List Patient Active Problem List   Diagnosis Date Noted  . Anemia 05/04/2020  . Swelling of limb 05/04/2020  . SVT (supraventricular tachycardia) (HCC) 06/23/2019  . Chronic heartburn 08/27/2018  . Slow transit constipation 08/27/2018  . Other dysphagia 05/24/2017  . Raynaud disease 02/14/2017  . Systemic lupus (HCC) 02/23/2016  . SLE-Sjogren overlap syndrome (HCC) 02/23/2016  . B12 deficiency 02/23/2016  . Hypertension 02/23/2016  . DDD (degenerative disc disease), lumbar 06/15/2014  . Lumbar radiculitis 06/15/2014  . Midline low back pain without  sciatica 04/16/2014  . Lupus pernio 04/16/2014    Loel Dubonnetheresa Ayo Guarino, MS, OTR/L, [facility] 08/25/20 5:37 PM  [facility] [address] [place], [place], [address] Phone: [phone]   Fax:  [phone]  Name: Amie Critchleyammy R Blew MRN: [id number] Date of Birth: 1966/01/13

## 2020-08-30 ENCOUNTER — Encounter: Payer: No Typology Code available for payment source | Admitting: Occupational Therapy

## 2020-08-31 ENCOUNTER — Other Ambulatory Visit: Payer: Self-pay

## 2020-09-01 ENCOUNTER — Encounter: Payer: No Typology Code available for payment source | Admitting: Occupational Therapy

## 2020-09-01 ENCOUNTER — Other Ambulatory Visit: Payer: Self-pay

## 2020-09-01 ENCOUNTER — Ambulatory Visit: Payer: No Typology Code available for payment source | Admitting: Occupational Therapy

## 2020-09-01 DIAGNOSIS — I890 Lymphedema, not elsewhere classified: Secondary | ICD-10-CM

## 2020-09-02 NOTE — Therapy (Signed)
[place] [facility] MAIN [facility] SERVICES [address] [place], [place], [address] Phone: [phone]   Fax:  [phone]  Occupational Therapy Treatment  Patient Details  Name: Natalie Wheeler MRN: [id number] Date of Birth: [birth year] Referring Provider (OT): Daniel Nones, MD   Encounter Date: 09/01/2020   OT End of Session - 09/01/20 1504    Visit Number 2    Number of Visits 36    Date for OT Re-Evaluation 11/23/20    Authorization Type [place]    OT Start Time 0303    OT Stop Time 0407    OT Time Calculation (min) 64 min    Activity Tolerance Patient tolerated treatment well;No increased pain    Behavior During Therapy WFL for tasks assessed/performed           Past Medical History:  Diagnosis Date  . Anemia   . Cervicitis   . Decreased libido   . Degenerative disc disease, lumbar    L1-L5  . Dyspareunia   . Hypertension   . Keratosis pilaris   . Lupus (HCC)   . Lupus (HCC)   . Motion sickness    ships, back seat cars  . Raynaud's disease   . Raynaud's syndrome   . Raynauds syndrome   . Sciatica    right side  . Sjogren's disease (HCC)   . SVT (supraventricular tachycardia) (HCC)    controlled with metoprolol  . URI (upper respiratory infection)    Head cold, started last week. Antibiotic and prenisone taper rx'd on 02/12/17. Improved.  . Vaginal Pap smear, abnormal     Past Surgical History:  Procedure Laterality Date  . BAND HEMORRHOIDECTOMY    . COLONOSCOPY WITH PROPOFOL N/A 08/03/2017   Procedure: COLONOSCOPY WITH PROPOFOL;  Surgeon: Scot Jun, MD;  Location: [facility] ENDOSCOPY;  Service: Endoscopy;  Laterality: N/A;  . LEEP    . NASAL SEPTOPLASTY W/ TURBINOPLASTY Bilateral 02/21/2017   Procedure: NASAL SEPTOPLASTY WITH  INFERIOR TURBINATE REDUCTION;  Surgeon: Bud Face, MD;  Location: [facility]ERY [facility];  Service: ENT;  Laterality: Bilateral;  . PRK Bilateral   . TONSILLECTOMY      There were no vitals filed  for this visit.   Subjective Assessment - 09/01/20 1611    Subjective  Natalie Wheeler presents for OT visit 1/36 to address BLE swelling. Pt has no new complaints since initial eval and pain is unchanged.    Pertinent History relevant to lymphedema (LE) systemic lupus, htn, Raynaud's Syndrome, hx  Keritosis piaris, DJD L1-L5, supraventricular tachycardia    Limitations Leg swelling and associated  pain limits ability to fit preferred street shoes, ability to perform exercise for health, limits abilty to perform work activities and some leisure pursuits, limits social participation, limits body image (clothing selection)    Repetition Increases Symptoms    Special Tests +Stemmer    Pain Onset More than a month ago               LYMPHEDEMA/ONCOLOGY QUESTIONNAIRE - 09/02/20 0001      Lymphedema Assessments   Lymphedema Assessments Lower extremities      Right Lower Extremity Lymphedema   Other RLE (dominant0  A-D volume = 2762.3 ML; e-g VOLUME = 5591.4 ML; a-g VOLUME = 8353.7 ML.      Left Lower Extremity Lymphedema   Other LLE A-D limb volume = 2849.5 ml; E-G= 5825.1 ml; A-G vlume= 8674.6 ml.    Other Limb volume differential (LVD) for legs (A-D) =  3.1%, L>R. LVD for thighs (E-G) = 4.0 %, L>R. LVD for full limb (A-G) = 3.7%, L>R                   OT Treatments/Exercises (OP) - 09/01/20 1612      ADLs   ADL Education Given Yes      Manual Therapy   Manual Therapy Edema management    Manual therapy comments Initial comparative limb volumetrics                  OT Education - 09/02/20 0855    Education Details Provided Pt education regarding lymphatic structure and function, etiologies, onset patterns and stages of progression. Discussed  impact of obesity on lymphatic function. Outlined Complete Decongestive Therapy (CDT)  as standard of care and provided in depth information regarding 4 primary components of both Intensive and Self Management Phases, including Manual  Lymph Drainage (MLD), compression wrapping and garments, skin care, and therapeutic exercise.   Natalie Wheeler discussion of high burden of care and contributing impact of existing co morbidities. We discussed  Importance of daily, ongoing LE self-care essential to retaining clinical gains and limiting progression.  Also discussed recent ersearch related to lymphedema and Lupus.    Person(s) Educated Patient    Methods Explanation;Demonstration;Handout    Comprehension Verbalized understanding;Returned demonstration;Need further instruction               OT Long Term Goals - 08/24/20 1704      OT LONG TERM GOAL #1   Title Pt will be able to apply knee length, multi-layer, short stretch compression wraps using correct gradient techniques with extra time (modified independence) to return affected limb to premorbid size and shape, to limit leg pain and infection risk, and to improve safe functional ambulation and mobility.    Baseline Max A    Time 4    Period Days    Status New      OT LONG TERM GOAL #2   Title Patient will demonstrate understanding of LE precautions, prevention strategies and cellulitis signs and symptoms by verbalizing at least 4 examples using printed reference with modified assistance to limit infection risk, recurrent wounds and LE progression.    Baseline Max A    Time 4    Period Days    Status New      OT LONG TERM GOAL #3   Title Pt will achieve at least a 5%  limb volume reduction  in both legs during Intensive Phase CDT to prevent re-accumulation of lymphatic congestion and progression of fibrosis, to limit infection risk, to improve functional ambulation and transfers, and  to improve functional performance of basic and instrumental ADLs, and to limit LE progression.    Baseline Max A    Time 12    Period Weeks    Status New    Target Date 11/22/20      OT LONG TERM GOAL #4   Title Pt will achieve and sustain a least 85% compliance performing all daily LE  self-care home program components throughout Intensive Phase CDT, including recommended skin care regime, lymphatic pumping ther ex, 23/7 compression wraps and simple self-MLD, to ensure optimal limb volume reduction, to limit infection risk and to limit LE progression.    Baseline Max A    Time 12    Period Weeks    Status New    Target Date 11/22/20      OT LONG TERM GOAL #5  Title By DC from OT Pt will be able to don and doff appropriate daytime compression garments and HOS devices with Max CG assistance to limit lymphatic re-accumulation and LE progression with before transitioning to self-management phase of CDT.    Baseline Max A    Time 12    Period Weeks    Status New    Target Date 11/23/20      Long Term Additional Goals   Additional Long Term Goals Yes      OT LONG TERM GOAL #6   Title Pt will report decrease in lymphedema related leg pain/discomfort from 6/10 to 3/10 (50%) by OT DC to improve functional performance in all occupational domains and to improve quality of life.    Baseline Mod A    Time 12    Period Weeks    Status New    Target Date 11/22/20                 Plan - 09/02/20 0846    Clinical Impression Statement Commenced modified Intensive Phase CDT today w/ planned OT frequency at 1 x weekly. Will also explore insurance benefits for Flexitouch in effort to support LE self-care at home over time. Initial BLE comparative limb volumetrics reveal LLE limb volume is greater for all segments, leg, thigh and full limb, than dominant RLE volumes, but limb volume differentials (LVD) are not atypical. Limb volume differential (LVD) for legs (A-D) = 3.1%, L>R. LVD for thighs (E-G) = 4.0 %, L>R. LVD for full limb (A-G) = 3.7%, L>R.(Limb volume differential (LVD) for legs (A-D) = 3.1%, L>R. LVD for thighs (E-G) = 4.0 %, L>R. LVD for full limb (A-G) = 3.7%, L>R.  Pt reports her dominant RLE is always colder (2nd Raynaud's? ) than the LLE. How this condition may affect  joint and tissue inflammation and/ or blood and lymphatic circulation bilaterally, in combination with Lupus,  is unclear, but I expect there would be an effect on lymphatic function if one side id more involved than the other. Next visit we will commence MLD.    OT Occupational Profile and History Comprehensive Assessment- Review of records and extensive additional review of physical, cognitive, psychosocial history related to current functional performance    Occupational performance deficits (Please refer to evaluation for details): ADL's;IADL's;Work;Leisure;Social Participation;Other   body image   Body Structure / Function / Physical Skills ADL;Decreased knowledge of use of DME;Pain;Edema;IADL;Decreased knowledge of precautions;Skin integrity    Rehab Potential Good    Clinical Decision Making Several treatment options, min-mod task modification necessary    Comorbidities Affecting Occupational Performance: Presence of comorbidities impacting occupational performance    Comorbidities impacting occupational performance description: SEE SUBJECTIVE    Modification or Assistance to Complete Evaluation  Min-Moderate modification of tasks or assist with assess necessary to complete eval    OT Frequency 1x / week   36 weeks   OT Duration Other (comment)    OT Treatment/Interventions Self-care/ADL training;DME and/or AE instruction;Manual lymph drainage;Compression bandaging;Therapeutic activities;Coping strategies training;Therapeutic exercise;Other (comment);Manual Therapy;Patient/family education   fit with appropriate compression garments and devices; coordinate trial with Advanced sequential pneumatic compression device ("pump" - Flexitouch   Plan Complete Decongestive Therapy (CDT): MLD, compression wraps and/ or garments and devices, skin care, ther ex    Consulted and Agree with Plan of Care Patient           Patient will benefit from skilled therapeutic intervention in order to improve the  following deficits and impairments:  Body Structure / Function / Physical Skills: ADL,Decreased knowledge of use of DME,Pain,Edema,IADL,Decreased knowledge of precautions,Skin integrity       Visit Diagnosis: Lymphedema, not elsewhere classified    Problem List Patient Active Problem List   Diagnosis Date Noted  . Anemia 05/04/2020  . Swelling of limb 05/04/2020  . SVT (supraventricular tachycardia) (HCC) 06/23/2019  . Chronic heartburn 08/27/2018  . Slow transit constipation 08/27/2018  . Other dysphagia 05/24/2017  . Raynaud disease 02/14/2017  . Systemic lupus (HCC) 02/23/2016  . SLE-Sjogren overlap syndrome (HCC) 02/23/2016  . B12 deficiency 02/23/2016  . Hypertension 02/23/2016  . DDD (degenerative disc disease), lumbar 06/15/2014  . Lumbar radiculitis 06/15/2014  . Midline low back pain without sciatica 04/16/2014  . Lupus pernio 04/16/2014    Loel Dubonnet, MS, OTR/L, [facility] 09/02/20 8:56 AM   [place] [facility] MAIN [facility] SERVICES [address] [place], [place], [address] Phone: [phone]   Fax:  [phone]  Name: Natalie Wheeler MRN: [id number] Date of Birth: January 26, 1966

## 2020-09-03 ENCOUNTER — Other Ambulatory Visit: Payer: Self-pay

## 2020-09-03 MED ORDER — AZELASTINE HCL 0.1 % NA SOLN
NASAL | 11 refills | Status: DC
  Filled 2020-09-03: qty 30, 30d supply, fill #0
  Filled 2020-11-25: qty 90, 90d supply, fill #1

## 2020-09-06 ENCOUNTER — Encounter: Payer: No Typology Code available for payment source | Admitting: Occupational Therapy

## 2020-09-07 ENCOUNTER — Other Ambulatory Visit: Payer: Self-pay

## 2020-09-08 ENCOUNTER — Ambulatory Visit: Payer: No Typology Code available for payment source | Admitting: Occupational Therapy

## 2020-09-08 ENCOUNTER — Other Ambulatory Visit: Payer: Self-pay

## 2020-09-08 ENCOUNTER — Encounter: Payer: No Typology Code available for payment source | Admitting: Occupational Therapy

## 2020-09-08 DIAGNOSIS — I890 Lymphedema, not elsewhere classified: Secondary | ICD-10-CM

## 2020-09-08 NOTE — Patient Instructions (Signed)
1. EXERCISE: Perform lymphatic pumping there ex 2 x a day. While wearing your compression wraps or garments. Perform 10 reps of each exercise bilaterally and be sure to perform them in order. Don;t skip around!  OMIT PARTIAL SIT UP  2. MLD: Perform simple self-Manual Lymphatic Drainage (MLD) at least once a day as directed.  3. WRAPS: Compression wraps are to be worn 23 hrs/ 7 days/wk during Intensive Phase of Complete Decongestive Therapy (CDT).Building tolerance may take time and practice, so don't get discouraged. If bandages begin to feel tight during periods of inactivity and/or during the night, try performing your exercises to loosen them.   4. GARMENTS: During Management Phase CDT your compression garments are to be worn during waking hours when active. Do NOT sleep in your garments!!   5. PUT YOUR FEET UP! Elevate your feet and legs and feet to the level of your heart whenever you are sitting down.   6. SKIN: Carefully monitor skin condition and perform impeccable hygiene daily. Bathe skin with mild soap and water and apply low pH lotion (aka Eucerin ) to improve hydration and limit infection risk.    Lymphatic Pumping Exercises:

## 2020-09-08 NOTE — Therapy (Signed)
[place] [facility] MAIN [facility] SERVICES [address] [place], [place], [address] Phone: [phone]   Fax:  [phone]  Occupational Therapy Treatment  Patient Details  Name: Natalie Wheeler MRN: [id number] Date of Birth: 06-May-1966 Referring Provider (OT): Daniel Nones, MD   Encounter Date: 09/08/2020   OT End of Session - 09/08/20 1627    Visit Number 3    Number of Visits 36    Date for OT Re-Evaluation 11/23/20    Authorization Type [place]    OT Start Time 0302    OT Stop Time 0405    OT Time Calculation (min) 63 min    Activity Tolerance Patient tolerated treatment well;No increased pain    Behavior During Therapy WFL for tasks assessed/performed           Past Medical History:  Diagnosis Date  . Anemia   . Cervicitis   . Decreased libido   . Degenerative disc disease, lumbar    L1-L5  . Dyspareunia   . Hypertension   . Keratosis pilaris   . Lupus (HCC)   . Lupus (HCC)   . Motion sickness    ships, back seat cars  . Raynaud's disease   . Raynaud's syndrome   . Raynauds syndrome   . Sciatica    right side  . Sjogren's disease (HCC)   . SVT (supraventricular tachycardia) (HCC)    controlled with metoprolol  . URI (upper respiratory infection)    Head cold, started last week. Antibiotic and prenisone taper rx'd on 02/12/17. Improved.  . Vaginal Pap smear, abnormal     Past Surgical History:  Procedure Laterality Date  . BAND HEMORRHOIDECTOMY    . COLONOSCOPY WITH PROPOFOL N/A 08/03/2017   Procedure: COLONOSCOPY WITH PROPOFOL;  Surgeon: Scot Jun, MD;  Location: [facility] ENDOSCOPY;  Service: Endoscopy;  Laterality: N/A;  . LEEP    . NASAL SEPTOPLASTY W/ TURBINOPLASTY Bilateral 02/21/2017   Procedure: NASAL SEPTOPLASTY WITH  INFERIOR TURBINATE REDUCTION;  Surgeon: Bud Face, MD;  Location: [facility]ERY [facility];  Service: ENT;  Laterality: Bilateral;  . PRK Bilateral   . TONSILLECTOMY      There were no vitals filed  for this visit.   Subjective Assessment - 09/08/20 1513    Subjective  Atienza presents for OT visit 3/36 to address BLE swelling. Pt reports "pain all over" at 5/10.    Pertinent History relevant to lymphedema (LE) systemic lupus, htn, Raynaud's Syndrome, hx  Keritosis piaris, DJD L1-L5, supraventricular tachycardia    Limitations Leg swelling and associated  pain limits ability to fit preferred street shoes, ability to perform exercise for health, limits abilty to perform work activities and some leisure pursuits, limits social participation, limits body image (clothing selection)    Repetition Increases Symptoms    Special Tests +Stemmer    Pain Onset More than a month ago                        OT Treatments/Exercises (OP) - 09/08/20 1631      ADLs   ADL Education Given Yes      Manual Therapy   Manual Therapy Edema management;Manual Lymphatic Drainage (MLD)    Manual Lymphatic Drainage (MLD) MLD to LLE/LLQ utilizing short neck sequence, teaching abdominal breathing to activate throacic duct, functional inguinalk LN, then providing proximal to distal J strokes to each leg segment with emphasis on posterior knee LN and "bottleneck" at medial knee. Good tolerance.  No increased pain.                  OT Education - 09/08/20 1634    Education Details intro level self MLD    Person(s) Educated Patient    Methods Explanation;Demonstration;Handout    Comprehension Verbalized understanding;Returned demonstration;Need further instruction               OT Long Term Goals - 08/24/20 1704      OT LONG TERM GOAL #1   Title Pt will be able to apply knee length, multi-layer, short stretch compression wraps using correct gradient techniques with extra time (modified independence) to return affected limb to premorbid size and shape, to limit leg pain and infection risk, and to improve safe functional ambulation and mobility.    Baseline Max A    Time 4    Period Days     Status New      OT LONG TERM GOAL #2   Title Patient will demonstrate understanding of LE precautions, prevention strategies and cellulitis signs and symptoms by verbalizing at least 4 examples using printed reference with modified assistance to limit infection risk, recurrent wounds and LE progression.    Baseline Max A    Time 4    Period Days    Status New      OT LONG TERM GOAL #3   Title Pt will achieve at least a 5%  limb volume reduction  in both legs during Intensive Phase CDT to prevent re-accumulation of lymphatic congestion and progression of fibrosis, to limit infection risk, to improve functional ambulation and transfers, and  to improve functional performance of basic and instrumental ADLs, and to limit LE progression.    Baseline Max A    Time 12    Period Weeks    Status New    Target Date 11/22/20      OT LONG TERM GOAL #4   Title Pt will achieve and sustain a least 85% compliance performing all daily LE self-care home program components throughout Intensive Phase CDT, including recommended skin care regime, lymphatic pumping ther ex, 23/7 compression wraps and simple self-MLD, to ensure optimal limb volume reduction, to limit infection risk and to limit LE progression.    Baseline Max A    Time 12    Period Weeks    Status New    Target Date 11/22/20      OT LONG TERM GOAL #5   Title By DC from OT Pt will be able to don and doff appropriate daytime compression garments and HOS devices with Max CG assistance to limit lymphatic re-accumulation and LE progression with before transitioning to self-management phase of CDT.    Baseline Max A    Time 12    Period Weeks    Status New    Target Date 11/23/20      Long Term Additional Goals   Additional Long Term Goals Yes      OT LONG TERM GOAL #6   Title Pt will report decrease in lymphedema related leg pain/discomfort from 6/10 to 3/10 (50%) by OT DC to improve functional performance in all occupational domains and  to improve quality of life.    Baseline Mod A    Time 12    Period Weeks    Status New    Target Date 11/22/20                 Plan - 09/08/20 1628  Clinical Impression Statement Commenced MLD to LLE/LLQ in supine. . Pt tolerated well without increased pain.Srtarted teraching intro level simple self MLD thrughout session illustrating J stroke and mapping anatomy of lymphatic structures. Cont as per POC.    OT Occupational Profile and History Comprehensive Assessment- Review of records and extensive additional review of physical, cognitive, psychosocial history related to current functional performance    Occupational performance deficits (Please refer to evaluation for details): ADL's;IADL's;Work;Leisure;Social Participation;Other   body image   Body Structure / Function / Physical Skills ADL;Decreased knowledge of use of DME;Pain;Edema;IADL;Decreased knowledge of precautions;Skin integrity    Rehab Potential Good    Clinical Decision Making Several treatment options, min-mod task modification necessary    Comorbidities Affecting Occupational Performance: Presence of comorbidities impacting occupational performance    Comorbidities impacting occupational performance description: SEE SUBJECTIVE    Modification or Assistance to Complete Evaluation  Min-Moderate modification of tasks or assist with assess necessary to complete eval    OT Frequency 1x / week   36 weeks   OT Duration Other (comment)    OT Treatment/Interventions Self-care/ADL training;DME and/or AE instruction;Manual lymph drainage;Compression bandaging;Therapeutic activities;Coping strategies training;Therapeutic exercise;Other (comment);Manual Therapy;Patient/family education   fit with appropriate compression garments and devices; coordinate trial with Advanced sequential pneumatic compression device ("pump" - Flexitouch   Plan Complete Decongestive Therapy (CDT): MLD, compression wraps and/ or garments and devices, skin  care, ther ex    Consulted and Agree with Plan of Care Patient           Patient will benefit from skilled therapeutic intervention in order to improve the following deficits and impairments:   Body Structure / Function / Physical Skills: ADL,Decreased knowledge of use of DME,Pain,Edema,IADL,Decreased knowledge of precautions,Skin integrity       Visit Diagnosis: Lymphedema, not elsewhere classified    Problem List Patient Active Problem List   Diagnosis Date Noted  . Anemia 05/04/2020  . Swelling of limb 05/04/2020  . SVT (supraventricular tachycardia) (HCC) 06/23/2019  . Chronic heartburn 08/27/2018  . Slow transit constipation 08/27/2018  . Other dysphagia 05/24/2017  . Raynaud disease 02/14/2017  . Systemic lupus (HCC) 02/23/2016  . SLE-Sjogren overlap syndrome (HCC) 02/23/2016  . B12 deficiency 02/23/2016  . Hypertension 02/23/2016  . DDD (degenerative disc disease), lumbar 06/15/2014  . Lumbar radiculitis 06/15/2014  . Midline low back pain without sciatica 04/16/2014  . Lupus pernio 04/16/2014   Loel Dubonnet, MS, OTR/L, [facility] 09/08/20 4:35 PM  [place] [facility] MAIN [facility] SERVICES [address] [place], [place], [address] Phone: [phone]   Fax:  [phone]  Name: KAIA DEPAOLIS MRN: [id number] Date of Birth: Sep 25, 1965

## 2020-09-13 ENCOUNTER — Encounter: Payer: No Typology Code available for payment source | Admitting: Occupational Therapy

## 2020-09-15 ENCOUNTER — Ambulatory Visit: Payer: No Typology Code available for payment source | Attending: Internal Medicine | Admitting: Occupational Therapy

## 2020-09-15 ENCOUNTER — Encounter: Payer: No Typology Code available for payment source | Admitting: Occupational Therapy

## 2020-09-15 DIAGNOSIS — I890 Lymphedema, not elsewhere classified: Secondary | ICD-10-CM | POA: Diagnosis present

## 2020-09-16 NOTE — Therapy (Signed)
[place] [facility] MAIN [facility] SERVICES [address] [place], [place], [address] Phone: [phone]   Fax:  [phone]  Occupational Therapy Treatment  Patient Details  Name: Natalie Wheeler MRN: [id number] Date of Birth: 24-May-1965 Referring Provider (OT): Daniel Nones, MD   Encounter Date: 09/15/2020   OT End of Session - 09/15/20 1512    Visit Number 4    Number of Visits 36    Date for OT Re-Evaluation 11/23/20    Authorization Type [place]    OT Start Time 0300    OT Stop Time 0411    OT Time Calculation (min) 71 min    Activity Tolerance Patient tolerated treatment well;No increased pain    Behavior During Therapy WFL for tasks assessed/performed           Past Medical History:  Diagnosis Date  . Anemia   . Cervicitis   . Decreased libido   . Degenerative disc disease, lumbar    L1-L5  . Dyspareunia   . Hypertension   . Keratosis pilaris   . Lupus (HCC)   . Lupus (HCC)   . Motion sickness    ships, back seat cars  . Raynaud's disease   . Raynaud's syndrome   . Raynauds syndrome   . Sciatica    right side  . Sjogren's disease (HCC)   . SVT (supraventricular tachycardia) (HCC)    controlled with metoprolol  . URI (upper respiratory infection)    Head cold, started last week. Antibiotic and prenisone taper rx'd on 02/12/17. Improved.  . Vaginal Pap smear, abnormal     Past Surgical History:  Procedure Laterality Date  . BAND HEMORRHOIDECTOMY    . COLONOSCOPY WITH PROPOFOL N/A 08/03/2017   Procedure: COLONOSCOPY WITH PROPOFOL;  Surgeon: Scot Jun, MD;  Location: [facility] ENDOSCOPY;  Service: Endoscopy;  Laterality: N/A;  . LEEP    . NASAL SEPTOPLASTY W/ TURBINOPLASTY Bilateral 02/21/2017   Procedure: NASAL SEPTOPLASTY WITH  INFERIOR TURBINATE REDUCTION;  Surgeon: Bud Face, MD;  Location: [facility];  Service: ENT;  Laterality: Bilateral;  . PRK Bilateral   . TONSILLECTOMY      There were no vitals filed for  this visit.   Subjective Assessment - 09/15/20 1513    Subjective  Turrubiates presents for OT visit 3/36 to address BLE swelling. Pt reports "pain all over" at 5/10. Pt reports slight bruising at anterior L proximal thigh after last visit. Pt has no other new complaints.    Pertinent History relevant to lymphedema (LE) systemic lupus, htn, Raynaud's Syndrome, hx  Keritosis piaris, DJD L1-L5, supraventricular tachycardia    Limitations Leg swelling and associated  pain limits ability to fit preferred street shoes, ability to perform exercise for health, limits abilty to perform work activities and some leisure pursuits, limits social participation, limits body image (clothing selection)    Repetition Increases Symptoms    Special Tests +Stemmer    Pain Onset More than a month ago                        OT Treatments/Exercises (OP) - 09/16/20 1401      ADLs   ADL Education Given Yes      Manual Therapy   Manual Lymphatic Drainage (MLD) MLD to LLE/LLQ utilizing short neck sequence, teaching abdominal breathing to activate throacic duct, functional inguinalk LN, then providing proximal to distal J strokes to each leg segment with emphasis on posterior knee  LN and "bottleneck" at medial knee. Good tolerance. No increased pain.                  OT Education - 09/16/20 1402    Education Details Continued Pt/ CG edu for lymphedema self care  and home program throughout session. Topics include multilayer, gradient compression wrapping, simple self-MLD, therapeutic lymphatic pumping exercises, skin/nail care, risk reduction factors and LE precautions, compression garments/recommendations and wear and care schedule and compression garment donning / doffing using assistive devices. All questions answered to the Pt's satisfaction, and Pt demonstrates understanding by report.    Person(s) Educated Patient    Methods Explanation;Demonstration;Handout    Comprehension Verbalized  understanding;Returned demonstration;Need further instruction               OT Long Term Goals - 08/24/20 1704      OT LONG TERM GOAL #1   Title Pt will be able to apply knee length, multi-layer, short stretch compression wraps using correct gradient techniques with extra time (modified independence) to return affected limb to premorbid size and shape, to limit leg pain and infection risk, and to improve safe functional ambulation and mobility.    Baseline Max A    Time 4    Period Days    Status New      OT LONG TERM GOAL #2   Title Patient will demonstrate understanding of LE precautions, prevention strategies and cellulitis signs and symptoms by verbalizing at least 4 examples using printed reference with modified assistance to limit infection risk, recurrent wounds and LE progression.    Baseline Max A    Time 4    Period Days    Status New      OT LONG TERM GOAL #3   Title Pt will achieve at least a 5%  limb volume reduction  in both legs during Intensive Phase CDT to prevent re-accumulation of lymphatic congestion and progression of fibrosis, to limit infection risk, to improve functional ambulation and transfers, and  to improve functional performance of basic and instrumental ADLs, and to limit LE progression.    Baseline Max A    Time 12    Period Weeks    Status New    Target Date 11/22/20      OT LONG TERM GOAL #4   Title Pt will achieve and sustain a least 85% compliance performing all daily LE self-care home program components throughout Intensive Phase CDT, including recommended skin care regime, lymphatic pumping ther ex, 23/7 compression wraps and simple self-MLD, to ensure optimal limb volume reduction, to limit infection risk and to limit LE progression.    Baseline Max A    Time 12    Period Weeks    Status New    Target Date 11/22/20      OT LONG TERM GOAL #5   Title By DC from OT Pt will be able to don and doff appropriate daytime compression garments  and HOS devices with Max CG assistance to limit lymphatic re-accumulation and LE progression with before transitioning to self-management phase of CDT.    Baseline Max A    Time 12    Period Weeks    Status New    Target Date 11/23/20      Long Term Additional Goals   Additional Long Term Goals Yes      OT LONG TERM GOAL #6   Title Pt will report decrease in lymphedema related leg pain/discomfort from 6/10 to 3/10 (50%)  by OT DC to improve functional performance in all occupational domains and to improve quality of life.    Baseline Mod A    Time 12    Period Weeks    Status New    Target Date 11/22/20                 Plan - 09/16/20 1357    Clinical Impression Statement Pt reports sight bruising an anterior L thigh after last manual therapy session. Light bruise remains faint but still visible today. Pt denies pain and rash after last visit. Continued MLD to LLE/LLQ today as established with more time on leg below the knee and ankle where Pt c/o pain. Good tolerance during session. Cont to monitor each session for any increase in inflammatory response     due to Lupus comorbidity. Cont as per POC.    OT Occupational Profile and History Comprehensive Assessment- Review of records and extensive additional review of physical, cognitive, psychosocial history related to current functional performance    Occupational performance deficits (Please refer to evaluation for details): ADL's;IADL's;Work;Leisure;Social Participation;Other   body image   Body Structure / Function / Physical Skills ADL;Decreased knowledge of use of DME;Pain;Edema;IADL;Decreased knowledge of precautions;Skin integrity    Rehab Potential Good    Clinical Decision Making Several treatment options, min-mod task modification necessary    Comorbidities Affecting Occupational Performance: Presence of comorbidities impacting occupational performance    Comorbidities impacting occupational performance description: SEE  SUBJECTIVE    Modification or Assistance to Complete Evaluation  Min-Moderate modification of tasks or assist with assess necessary to complete eval    OT Frequency 1x / week   36 weeks   OT Duration Other (comment)    OT Treatment/Interventions Self-care/ADL training;DME and/or AE instruction;Manual lymph drainage;Compression bandaging;Therapeutic activities;Coping strategies training;Therapeutic exercise;Other (comment);Manual Therapy;Patient/family education   fit with appropriate compression garments and devices; coordinate trial with Advanced sequential pneumatic compression device ("pump" - Flexitouch   Plan Complete Decongestive Therapy (CDT): MLD, compression wraps and/ or garments and devices, skin care, ther ex    Consulted and Agree with Plan of Care Patient           Patient will benefit from skilled therapeutic intervention in order to improve the following deficits and impairments:   Body Structure / Function / Physical Skills: ADL,Decreased knowledge of use of DME,Pain,Edema,IADL,Decreased knowledge of precautions,Skin integrity       Visit Diagnosis: Lymphedema, not elsewhere classified    Problem List Patient Active Problem List   Diagnosis Date Noted  . Anemia 05/04/2020  . Swelling of limb 05/04/2020  . SVT (supraventricular tachycardia) (HCC) 06/23/2019  . Chronic heartburn 08/27/2018  . Slow transit constipation 08/27/2018  . Other dysphagia 05/24/2017  . Raynaud disease 02/14/2017  . Systemic lupus (HCC) 02/23/2016  . SLE-Sjogren overlap syndrome (HCC) 02/23/2016  . B12 deficiency 02/23/2016  . Hypertension 02/23/2016  . DDD (degenerative disc disease), lumbar 06/15/2014  . Lumbar radiculitis 06/15/2014  . Midline low back pain without sciatica 04/16/2014  . Lupus pernio 04/16/2014    Loel Dubonnet, MS, OTR/L, [facility] 09/16/20 2:03 PM  [place] [facility] MAIN [facility] SERVICES [address] [place], [place],  [address] Phone: [phone]   Fax:  [phone]  Name: ELEONOR OCON MRN: [id number] Date of Birth: Sep 10, 1965

## 2020-09-16 NOTE — Patient Instructions (Signed)
  1. EXERCISE: Perform lymphatic pumping there ex 2 x a day. While wearing your compression wraps or garments. Perform 10 reps of each exercise bilaterally and be sure to perform them in order. Don;t skip around!  OMIT PARTIAL SIT UP  2. MLD: Perform simple self-Manual Lymphatic Drainage (MLD) at least once a day as directed.  3. WRAPS: Compression wraps are to be worn 23 hrs/ 7 days/wk during Intensive Phase of Complete Decongestive Therapy (CDT).Building tolerance may take time and practice, so don't get discouraged. If bandages begin to feel tight during periods of inactivity and/or during the night, try performing your exercises to loosen them.   4. GARMENTS: During Management Phase CDT your compression garments are to be worn during waking hours when active. Do NOT sleep in your garments!!   5. PUT YOUR FEET UP! Elevate your feet and legs and feet to the level of your heart whenever you are sitting down.   6. SKIN: Carefully monitor skin condition and perform impeccable hygiene daily. Bathe skin with mild soap and water and apply low pH lotion (aka Eucerin ) to improve hydration and limit infection risk.

## 2020-09-20 ENCOUNTER — Encounter: Payer: No Typology Code available for payment source | Admitting: Occupational Therapy

## 2020-09-22 ENCOUNTER — Other Ambulatory Visit: Payer: Self-pay

## 2020-09-22 ENCOUNTER — Encounter: Payer: No Typology Code available for payment source | Admitting: Occupational Therapy

## 2020-09-22 ENCOUNTER — Ambulatory Visit: Payer: No Typology Code available for payment source | Admitting: Occupational Therapy

## 2020-09-22 DIAGNOSIS — I890 Lymphedema, not elsewhere classified: Secondary | ICD-10-CM | POA: Diagnosis not present

## 2020-09-22 NOTE — Therapy (Signed)
[place] [facility] MAIN [facility] SERVICES [address] [place], [place], [address] Phone: [phone]   Fax:  [phone]  Occupational Therapy Treatment  Patient Details  Name: Natalie Wheeler MRN: [id number] Date of Birth: 02/14/1966 Referring Provider (OT): Daniel Nones, MD   Encounter Date: 09/22/2020   OT End of Session - 09/22/20 1609    Visit Number 5    Number of Visits 36    Date for OT Re-Evaluation 11/23/20    Authorization Type [place]    OT Start Time 0305    OT Stop Time 0405    OT Time Calculation (min) 60 min    Activity Tolerance Patient tolerated treatment well;No increased pain    Behavior During Therapy WFL for tasks assessed/performed           Past Medical History:  Diagnosis Date  . Anemia   . Cervicitis   . Decreased libido   . Degenerative disc disease, lumbar    L1-L5  . Dyspareunia   . Hypertension   . Keratosis pilaris   . Lupus (HCC)   . Lupus (HCC)   . Motion sickness    ships, back seat cars  . Raynaud's disease   . Raynaud's syndrome   . Raynauds syndrome   . Sciatica    right side  . Sjogren's disease (HCC)   . SVT (supraventricular tachycardia) (HCC)    controlled with metoprolol  . URI (upper respiratory infection)    Head cold, started last week. Antibiotic and prenisone taper rx'd on 02/12/17. Improved.  . Vaginal Pap smear, abnormal     Past Surgical History:  Procedure Laterality Date  . BAND HEMORRHOIDECTOMY    . COLONOSCOPY WITH PROPOFOL N/A 08/03/2017   Procedure: COLONOSCOPY WITH PROPOFOL;  Surgeon: Scot Jun, MD;  Location: [facility] END[facility]COPY;  Service: Endoscopy;  Laterality: N/A;  . LEEP    . NASAL SEPTOPLASTY W/ TURBINOPLASTY Bilateral 02/21/2017   Procedure: NASAL SEPTOPLASTY WITH  INFERIOR TURBINATE REDUCTION;  Surgeon: Bud Face, MD;  Location: [facility];  Service: ENT;  Laterality: Bilateral;  . PRK Bilateral   . TONSILLECTOMY      There were no vitals filed  for this visit.   Subjective Assessment - 09/22/20 1610    Subjective  Natalie Wheeler presents for OT visit 5/36 to address BLE swelling. Pt reports pain level unchanged. Pt reports good tolerance for MLD to LLE/LLQ after last session.    Pertinent History relevant to lymphedema (LE) systemic lupus, htn, Raynaud's Syndrome, hx  Keritosis piaris, DJD L1-L5, supraventricular tachycardia    Limitations Leg swelling and associated  pain limits ability to fit preferred street shoes, ability to perform exercise for health, limits abilty to perform work activities and some leisure pursuits, limits social participation, limits body image (clothing selection)    Repetition Increases Symptoms    Special Tests +Stemmer    Pain Onset More than a month ago                        OT Treatments/Exercises (OP) - 09/22/20 1611      ADLs   ADL Education Given Yes      Manual Therapy   Manual Therapy Edema management;Manual Lymphatic Drainage (MLD)    Manual Lymphatic Drainage (MLD) MLD to LLE/LLQ  and RLE/RLQ utilizing short neck sequence, teaching abdominal breathing to activate throacic duct, functional inguinal LN, then providing proximal to distal J strokes to each lower extremity segment  with emphasis on posterior knee LN and "bottleneck" at medial knee. Good tolerance. No increased pain.                  OT Education - 09/22/20 1613    Education Details Continued Pt/ CG edu for lymphedema self care  and home program throughout session. Topics include multilayer, gradient compression wrapping, simple self-MLD, therapeutic lymphatic pumping exercises, skin/nail care, risk reduction factors and LE precautions, compression garments/recommendations and wear and care schedule and compression garment donning / doffing using assistive devices. All questions answered to the Pt's satisfaction, and Pt demonstrates understanding by report.    Person(s) Educated Patient    Methods  Explanation;Demonstration;Handout    Comprehension Verbalized understanding;Returned demonstration;Need further instruction               OT Long Term Goals - 08/24/20 1704      OT LONG TERM GOAL #1   Title Pt will be able to apply knee length, multi-layer, short stretch compression wraps using correct gradient techniques with extra time (modified independence) to return affected limb to premorbid size and shape, to limit leg pain and infection risk, and to improve safe functional ambulation and mobility.    Baseline Max A    Time 4    Period Days    Status New      OT LONG TERM GOAL #2   Title Patient will demonstrate understanding of LE precautions, prevention strategies and cellulitis signs and symptoms by verbalizing at least 4 examples using printed reference with modified assistance to limit infection risk, recurrent wounds and LE progression.    Baseline Max A    Time 4    Period Days    Status New      OT LONG TERM GOAL #3   Title Pt will achieve at least a 5%  limb volume reduction  in both legs during Intensive Phase CDT to prevent re-accumulation of lymphatic congestion and progression of fibrosis, to limit infection risk, to improve functional ambulation and transfers, and  to improve functional performance of basic and instrumental ADLs, and to limit LE progression.    Baseline Max A    Time 12    Period Weeks    Status New    Target Date 11/22/20      OT LONG TERM GOAL #4   Title Pt will achieve and sustain a least 85% compliance performing all daily LE self-care home program components throughout Intensive Phase CDT, including recommended skin care regime, lymphatic pumping ther ex, 23/7 compression wraps and simple self-MLD, to ensure optimal limb volume reduction, to limit infection risk and to limit LE progression.    Baseline Max A    Time 12    Period Weeks    Status New    Target Date 11/22/20      OT LONG TERM GOAL #5   Title By DC from OT Pt will be  able to don and doff appropriate daytime compression garments and HOS devices with Max CG assistance to limit lymphatic re-accumulation and LE progression with before transitioning to self-management phase of CDT.    Baseline Max A    Time 12    Period Weeks    Status New    Target Date 11/23/20      Long Term Additional Goals   Additional Long Term Goals Yes      OT LONG TERM GOAL #6   Title Pt will report decrease in lymphedema related leg pain/discomfort  from 6/10 to 3/10 (50%) by OT DC to improve functional performance in all occupational domains and to improve quality of life.    Baseline Mod A    Time 12    Period Weeks    Status New    Target Date 11/22/20                 Plan - 09/22/20 1613    Clinical Impression Statement Pt reports good tolerance for MLD after last session. Continued MLD to LLE/LLQ aain this week  for 75% of session, then commenced MLD to RLE/RLQ for remaining 15 minutes. RLE emphasis on popliteal fossa to improve lymphatic function and hopefully to reduce painful congestion and inflammation in this area. Cont as per OPC.    OT Occupational Profile and History Comprehensive Assessment- Review of records and extensive additional review of physical, cognitive, psychosocial history related to current functional performance    Occupational performance deficits (Please refer to evaluation for details): ADL's;IADL's;Work;Leisure;Social Participation;Other   body image   Body Structure / Function / Physical Skills ADL;Decreased knowledge of use of DME;Pain;Edema;IADL;Decreased knowledge of precautions;Skin integrity    Rehab Potential Good    Clinical Decision Making Several treatment options, min-mod task modification necessary    Comorbidities Affecting Occupational Performance: Presence of comorbidities impacting occupational performance    Comorbidities impacting occupational performance description: SEE SUBJECTIVE    Modification or Assistance to Complete  Evaluation  Min-Moderate modification of tasks or assist with assess necessary to complete eval    OT Frequency 1x / week   36 weeks   OT Duration Other (comment)    OT Treatment/Interventions Self-care/ADL training;DME and/or AE instruction;Manual lymph drainage;Compression bandaging;Therapeutic activities;Coping strategies training;Therapeutic exercise;Other (comment);Manual Therapy;Patient/family education   fit with appropriate compression garments and devices; coordinate trial with Advanced sequential pneumatic compression device ("pump" - Flexitouch   Plan Complete Decongestive Therapy (CDT): MLD, compression wraps and/ or garments and devices, skin care, ther ex    Consulted and Agree with Plan of Care Patient           Patient will benefit from skilled therapeutic intervention in order to improve the following deficits and impairments:   Body Structure / Function / Physical Skills: ADL,Decreased knowledge of use of DME,Pain,Edema,IADL,Decreased knowledge of precautions,Skin integrity       Visit Diagnosis: Lymphedema, not elsewhere classified    Problem List Patient Active Problem List   Diagnosis Date Noted  . Anemia 05/04/2020  . Swelling of limb 05/04/2020  . SVT (supraventricular tachycardia) (HCC) 06/23/2019  . Chronic heartburn 08/27/2018  . Slow transit constipation 08/27/2018  . Other dysphagia 05/24/2017  . Raynaud disease 02/14/2017  . Systemic lupus (HCC) 02/23/2016  . SLE-Sjogren overlap syndrome (HCC) 02/23/2016  . B12 deficiency 02/23/2016  . Hypertension 02/23/2016  . DDD (degenerative disc disease), lumbar 06/15/2014  . Lumbar radiculitis 06/15/2014  . Midline low back pain without sciatica 04/16/2014  . Lupus pernio 04/16/2014    Loel Dubonnet, MS, OTR/L, [facility] 09/22/20 4:16 PM  [place] [facility] MAIN [facility] SERVICES [address] [place], [place], [address] Phone: [phone]   Fax:  [phone]  Name:  Natalie Wheeler MRN: [id number] Date of Birth: 1965-11-28

## 2020-09-27 ENCOUNTER — Encounter: Payer: No Typology Code available for payment source | Admitting: Occupational Therapy

## 2020-09-28 ENCOUNTER — Other Ambulatory Visit: Payer: Self-pay

## 2020-09-28 MED FILL — Progesterone Cap 200 MG: ORAL | 90 days supply | Qty: 90 | Fill #0 | Status: AC

## 2020-09-29 ENCOUNTER — Other Ambulatory Visit: Payer: Self-pay

## 2020-09-29 ENCOUNTER — Encounter: Payer: No Typology Code available for payment source | Admitting: Occupational Therapy

## 2020-09-29 ENCOUNTER — Ambulatory Visit: Payer: No Typology Code available for payment source | Admitting: Occupational Therapy

## 2020-09-29 DIAGNOSIS — I890 Lymphedema, not elsewhere classified: Secondary | ICD-10-CM

## 2020-09-29 NOTE — Therapy (Signed)
[place] [facility] MAIN [facility] SERVICES [address] [place], [place], [address] Phone: [phone]   Fax:  [phone]  Occupational Therapy Treatment  Patient Details  Name: Natalie Wheeler MRN: [id number] Date of Birth: 1965-09-26 Referring Provider (OT): Daniel Nones, MD   Encounter Date: 09/29/2020   OT End of Session - 09/29/20 1509    Visit Number 6    Number of Visits 36    Date for OT Re-Evaluation 11/23/20    Authorization Type [place]    OT Start Time 0305    OT Stop Time 0405    OT Time Calculation (min) 60 min    Activity Tolerance Patient tolerated treatment well;No increased pain    Behavior During Therapy WFL for tasks assessed/performed           Past Medical History:  Diagnosis Date  . Anemia   . Cervicitis   . Decreased libido   . Degenerative disc disease, lumbar    L1-L5  . Dyspareunia   . Hypertension   . Keratosis pilaris   . Lupus (HCC)   . Lupus (HCC)   . Motion sickness    ships, back seat cars  . Raynaud's disease   . Raynaud's syndrome   . Raynauds syndrome   . Sciatica    right side  . Sjogren's disease (HCC)   . SVT (supraventricular tachycardia) (HCC)    controlled with metoprolol  . URI (upper respiratory infection)    Head cold, started last week. Antibiotic and prenisone taper rx'd on 02/12/17. Improved.  . Vaginal Pap smear, abnormal     Past Surgical History:  Procedure Laterality Date  . BAND HEMORRHOIDECTOMY    . COLONOSCOPY WITH PROPOFOL N/A 08/03/2017   Procedure: COLONOSCOPY WITH PROPOFOL;  Surgeon: Scot Jun, MD;  Location: [facility];  Service: Endoscopy;  Laterality: N/A;  . LEEP    . NASAL SEPTOPLASTY W/ TURBINOPLASTY Bilateral 02/21/2017   Procedure: NASAL SEPTOPLASTY WITH  INFERIOR TURBINATE REDUCTION;  Surgeon: Bud Face, MD;  Location[facility];  Service: ENT;  Laterality: Bilateral;  . PRK Bilateral   . TONSILLECTOMY      There were no vitals filed  for this visit.   Subjective Assessment - 09/29/20 1510    Subjective  Abruzzese presents for OT visit 6/36 to address BLE swelling. Pt reports pain level unchanged. Pt reports good tolerance for MLD to LLE/LLQ after last session.    Pertinent History relevant to lymphedema (LE) systemic lupus, htn, Raynaud's Syndrome, hx  Keritosis piaris, DJD L1-L5, supraventricular tachycardia    Limitations Leg swelling and associated  pain limits ability to fit preferred street shoes, ability to perform exercise for health, limits abilty to perform work activities and some leisure pursuits, limits social participation, limits body image (clothing selection)    Repetition Increases Symptoms    Special Tests +Stemmer    Pain Onset More than a month ago                                OT Education - 09/29/20 1629    Education Details Continued Pt/ CG edu for lymphedema self care  and home program throughout session. Topics include multilayer, gradient compression wrapping, simple self-MLD, therapeutic lymphatic pumping exercises, skin/nail care, risk reduction factors and LE precautions, compression garments/recommendations and wear and care schedule and compression garment donning / doffing using assistive devices. All questions answered to the  Pt's satisfaction, and Pt demonstrates understanding by report.    Person(s) Educated Patient    Methods Explanation;Demonstration;Handout    Comprehension Verbalized understanding;Returned demonstration;Need further instruction               OT Long Term Goals - 08/24/20 1704      OT LONG TERM GOAL #1   Title Pt will be able to apply knee length, multi-layer, short stretch compression wraps using correct gradient techniques with extra time (modified independence) to return affected limb to premorbid size and shape, to limit leg pain and infection risk, and to improve safe functional ambulation and mobility.    Baseline Max A    Time 4     Period Days    Status New      OT LONG TERM GOAL #2   Title Patient will demonstrate understanding of LE precautions, prevention strategies and cellulitis signs and symptoms by verbalizing at least 4 examples using printed reference with modified assistance to limit infection risk, recurrent wounds and LE progression.    Baseline Max A    Time 4    Period Days    Status New      OT LONG TERM GOAL #3   Title Pt will achieve at least a 5%  limb volume reduction  in both legs during Intensive Phase CDT to prevent re-accumulation of lymphatic congestion and progression of fibrosis, to limit infection risk, to improve functional ambulation and transfers, and  to improve functional performance of basic and instrumental ADLs, and to limit LE progression.    Baseline Max A    Time 12    Period Weeks    Status New    Target Date 11/22/20      OT LONG TERM GOAL #4   Title Pt will achieve and sustain a least 85% compliance performing all daily LE self-care home program components throughout Intensive Phase CDT, including recommended skin care regime, lymphatic pumping ther ex, 23/7 compression wraps and simple self-MLD, to ensure optimal limb volume reduction, to limit infection risk and to limit LE progression.    Baseline Max A    Time 12    Period Weeks    Status New    Target Date 11/22/20      OT LONG TERM GOAL #5   Title By DC from OT Pt will be able to don and doff appropriate daytime compression garments and HOS devices with Max CG assistance to limit lymphatic re-accumulation and LE progression with before transitioning to self-management phase of CDT.    Baseline Max A    Time 12    Period Weeks    Status New    Target Date 11/23/20      Long Term Additional Goals   Additional Long Term Goals Yes      OT LONG TERM GOAL #6   Title Pt will report decrease in lymphedema related leg pain/discomfort from 6/10 to 3/10 (50%) by OT DC to improve functional performance in all occupational  domains and to improve quality of life.    Baseline Mod A    Time 12    Period Weeks    Status New    Target Date 11/22/20                 Plan - 09/29/20 1624    Clinical Impression Statement Provided mld TO BILATERAL LOWER QUADRANTS AND LOWER EXTREMITIES WITH EMPHASIS ON LLE. Provided skin care to facilitate improved skin hydration and mobility  simultaneously using low ph castor oil in effort to match ph of skin to limit infection risk. Had brief  discussion of consideration for ccl 1 GRADIENT compression garments or legging type for work out to reduce lymphatic load of water in legs,  and to assist with muscle pump  action and muscle recovery to limit inflammation and fatigue. Marland Kitchen Pt will research on line using suggested brand. Provided description of process for upcoming flexitouch trial at next visit. Cont as per POC.    OT Occupational Profile and History Comprehensive Assessment- Review of records and extensive additional review of physical, cognitive, psychosocial history related to current functional performance    Occupational performance deficits (Please refer to evaluation for details): ADL's;IADL's;Work;Leisure;Social Participation;Other   body image   Body Structure / Function / Physical Skills ADL;Decreased knowledge of use of DME;Pain;Edema;IADL;Decreased knowledge of precautions;Skin integrity    Rehab Potential Good    Clinical Decision Making Several treatment options, min-mod task modification necessary    Comorbidities Affecting Occupational Performance: Presence of comorbidities impacting occupational performance    Comorbidities impacting occupational performance description: SEE SUBJECTIVE    Modification or Assistance to Complete Evaluation  Min-Moderate modification of tasks or assist with assess necessary to complete eval    OT Frequency 1x / week   36 weeks   OT Duration Other (comment)    OT Treatment/Interventions Self-care/ADL training;DME and/or AE  instruction;Manual lymph drainage;Compression bandaging;Therapeutic activities;Coping strategies training;Therapeutic exercise;Other (comment);Manual Therapy;Patient/family education   fit with appropriate compression garments and devices; coordinate trial with Advanced sequential pneumatic compression device ("pump" - Flexitouch   Plan Complete Decongestive Therapy (CDT): MLD, compression wraps and/ or garments and devices, skin care, ther ex    Consulted and Agree with Plan of Care Patient           Patient will benefit from skilled therapeutic intervention in order to improve the following deficits and impairments:   Body Structure / Function / Physical Skills: ADL,Decreased knowledge of use of DME,Pain,Edema,IADL,Decreased knowledge of precautions,Skin integrity       Visit Diagnosis: Lymphedema, not elsewhere classified    Problem List Patient Active Problem List   Diagnosis Date Noted  . Anemia 05/04/2020  . Swelling of limb 05/04/2020  . SVT (supraventricular tachycardia) (HCC) 06/23/2019  . Chronic heartburn 08/27/2018  . Slow transit constipation 08/27/2018  . Other dysphagia 05/24/2017  . Raynaud disease 02/14/2017  . Systemic lupus (HCC) 02/23/2016  . SLE-Sjogren overlap syndrome (HCC) 02/23/2016  . B12 deficiency 02/23/2016  . Hypertension 02/23/2016  . DDD (degenerative disc disease), lumbar 06/15/2014  . Lumbar radiculitis 06/15/2014  . Midline low back pain without sciatica 04/16/2014  . Lupus pernio 04/16/2014    Loel Dubonnet, MS, OTR/L, [facility] 09/29/20 4:31 PM  [place] [facility] MAIN [facility] SERVICES [address] [place], [place], [address] Phone: [phone]   Fax:  [phone]  Name: Natalie Wheeler MRN: [id number] Date of Birth: Jul 16, 1965

## 2020-10-06 ENCOUNTER — Other Ambulatory Visit: Payer: Self-pay

## 2020-10-06 ENCOUNTER — Ambulatory Visit: Payer: No Typology Code available for payment source | Admitting: Occupational Therapy

## 2020-10-06 DIAGNOSIS — I890 Lymphedema, not elsewhere classified: Secondary | ICD-10-CM | POA: Diagnosis not present

## 2020-10-06 NOTE — Therapy (Signed)
[facility] MAIN [facility] SERVICES [address] [place], [place], [address] Phone: [phone]   Fax:  [phone]  Occupational Therapy Treatment  Patient Details  Name: Natalie Wheeler MRN: [id number] Date of Birth: July 23, 1965 Referring Provider (OT): Daniel Nones, MD   Encounter Date: 10/06/2020   OT End of Session - 10/06/20 1554    Visit Number 7    Number of Visits 36    Date for OT Re-Evaluation 11/23/20    Authorization Type [place]    OT Start Time 0312    OT Stop Time 0412    OT Time Calculation (min) 60 min    Equipment Utilized During Treatment advanced Flexitouch sequential pneumatic compression device    Activity Tolerance Patient tolerated treatment well;No increased pain    Behavior During Therapy WFL for tasks assessed/performed           Past Medical History:  Diagnosis Date  . Anemia   . Cervicitis   . Decreased libido   . Degenerative disc disease, lumbar    L1-L5  . Dyspareunia   . Hypertension   . Keratosis pilaris   . Lupus (HCC)   . Lupus (HCC)   . Motion sickness    ships, back seat cars  . Raynaud's disease   . Raynaud's syndrome   . Raynauds syndrome   . Sciatica    right side  . Sjogren's disease (HCC)   . SVT (supraventricular tachycardia) (HCC)    controlled with metoprolol  . URI (upper respiratory infection)    Head cold, started last week. Antibiotic and prenisone taper rx'd on 02/12/17. Improved.  . Vaginal Pap smear, abnormal     Past Surgical History:  Procedure Laterality Date  . BAND HEMORRHOIDECTOMY    . COLONOSCOPY WITH PROPOFOL N/A 08/03/2017   Procedure: COLONOSCOPY WITH PROPOFOL;  Surgeon: Scot Jun, MD;  Location: [facility] ENDOSCOPY;  Service: Endoscopy;  Laterality: N/A;  . LEEP    . NASAL SEPTOPLASTY W/ TURBINOPLASTY Bilateral 02/21/2017   Procedure: NASAL SEPTOPLASTY WITH  INFERIOR TURBINATE REDUCTION;  Surgeon: Bud Face, MD;  Location: [facility];  Service:  ENT;  Laterality: Bilateral;  . PRK Bilateral   . TONSILLECTOMY      There were no vitals filed for this visit.   Subjective Assessment - 10/06/20 1524    Subjective  Natalie Wheeler is a 55 yo female presenting for OT visit 7/36 to address chronic, progressive, BLE lymphedema. Pt reports pain level unchanged. Pt reports good tolerance for MLD to LLE/LLQ after last session. Manufacturer's rep for [employer], Karrie Meres, is here today to assist w/ trial of Flexitouch advanced sequential pneumatic compression device ([id number]) to address BLE/BLQ lymphedema (I89.0) secondary to chronic systemic lupus and the chronmic inflamatory response associated with autoimune disease.    Pertinent History relevant to lymphedema (LE) systemic lupus, htn, Raynaud's Syndrome, hx  Keritosis piaris, DJD L1-L5, supraventricular tachycardia    Limitations Leg swelling and associated  pain limits ability to fit preferred street shoes, ability to perform exercise for health, limits abilty to perform work activities and some leisure pursuits, limits social participation, limits body image (clothing selection)    Repetition Increases Symptoms    Special Tests +Stemmer    Pain Onset More than a month ago                        OT Treatments/Exercises (OP) - 10/06/20 1543  ADLs   ADL Education Given Yes      Manual Therapy   Manual Therapy Edema management    Edema Management Flexitouch trial                  OT Education - 10/06/20 1546    Education Details Pt and family education for Flexitouch advanced sequential pneumatic compression device, or "pump", including how it works, indications, contraindications, precautions, care and use routine. Patient's questions were answered throughout trial and handouts and Internet resources given for reference.    Person(s) Educated Patient    Methods Explanation;Demonstration;Handout    Comprehension Verbalized understanding;Returned  demonstration;Need further instruction               OT Long Term Goals - 08/24/20 1704      OT LONG TERM GOAL #1   Title Pt will be able to apply knee length, multi-layer, short stretch compression wraps using correct gradient techniques with extra time (modified independence) to return affected limb to premorbid size and shape, to limit leg pain and infection risk, and to improve safe functional ambulation and mobility.    Baseline Max A    Time 4    Period Days    Status New      OT LONG TERM GOAL #2   Title Patient will demonstrate understanding of LE precautions, prevention strategies and cellulitis signs and symptoms by verbalizing at least 4 examples using printed reference with modified assistance to limit infection risk, recurrent wounds and LE progression.    Baseline Max A    Time 4    Period Days    Status New      OT LONG TERM GOAL #3   Title Pt will achieve at least a 5%  limb volume reduction  in both legs during Intensive Phase CDT to prevent re-accumulation of lymphatic congestion and progression of fibrosis, to limit infection risk, to improve functional ambulation and transfers, and  to improve functional performance of basic and instrumental ADLs, and to limit LE progression.    Baseline Max A    Time 12    Period Weeks    Status New    Target Date 11/22/20      OT LONG TERM GOAL #4   Title Pt will achieve and sustain a least 85% compliance performing all daily LE self-care home program components throughout Intensive Phase CDT, including recommended skin care regime, lymphatic pumping ther ex, 23/7 compression wraps and simple self-MLD, to ensure optimal limb volume reduction, to limit infection risk and to limit LE progression.    Baseline Max A    Time 12    Period Weeks    Status New    Target Date 11/22/20      OT LONG TERM GOAL #5   Title By DC from OT Pt will be able to don and doff appropriate daytime compression garments and HOS devices with Max  CG assistance to limit lymphatic re-accumulation and LE progression with before transitioning to self-management phase of CDT.    Baseline Max A    Time 12    Period Weeks    Status New    Target Date 11/23/20      Long Term Additional Goals   Additional Long Term Goals Yes      OT LONG TERM GOAL #6   Title Pt will report decrease in lymphedema related leg pain/discomfort from 6/10 to 3/10 (50%) by OT DC to improve functional performance in all  occupational domains and to improve quality of life.    Baseline Mod A    Time 12    Period Weeks    Status New    Target Date 11/22/20                 Plan - 10/06/20 1547    Clinical Impression Statement Natalie Wheeler presents with stage II lymphedema of BLE secondary to chronic inflammatory autoimmune disease. She completed a 1 hr.  trial of the advanced sequential pneumatic compression device, the Flexitouch "pump", on the LLE using 30-40 mmHg without increased pain. Pt was educated throughout session re precautions, rational and care and use instructions.  The advanced Flexitouch [phone] pneumatic compression device with BLE garments and truncal garment is medically necessary to assist the patient with daily lymphedema self-care over time at home. The advanced, 32-chamber Flexitouch is the only available sequential pneumatic compression device providing proximal to distal lymphatic fluid return via regional lymph nodes (LN) deep abdominal pathways and the thoracic duct to the heart. The basic pneumatic pump is not appropriate for this patient because it provides distal-to-proximal, retrograde massage mobilizing tissue fluid against back pressure which then abruptly ends before reaching regional LNs leaving dense, protein rich fluid distal to the lymph nodes at joints. The Flexitouch pneumatic compression device is medically necessary for long term LE self-management at home, to help to limit progression of this chromic, incurable condition, and  to limit further functional decline 2/2 LE progression.    OT Occupational Profile and History Comprehensive Assessment- Review of records and extensive additional review of physical, cognitive, psychosocial history related to current functional performance    Occupational performance deficits (Please refer to evaluation for details): ADL's;IADL's;Work;Leisure;Social Participation;Other   body image   Body Structure / Function / Physical Skills ADL;Decreased knowledge of use of DME;Pain;Edema;IADL;Decreased knowledge of precautions;Skin integrity    Rehab Potential Good    Clinical Decision Making Several treatment options, min-mod task modification necessary    Comorbidities Affecting Occupational Performance: Presence of comorbidities impacting occupational performance    Comorbidities impacting occupational performance description: SEE SUBJECTIVE    Modification or Assistance to Complete Evaluation  Min-Moderate modification of tasks or assist with assess necessary to complete eval    OT Frequency 1x / week   36 weeks   OT Duration Other (comment)    OT Treatment/Interventions Self-care/ADL training;DME and/or AE instruction;Manual lymph drainage;Compression bandaging;Therapeutic activities;Coping strategies training;Therapeutic exercise;Other (comment);Manual Therapy;Patient/family education   fit with appropriate compression garments and devices; coordinate trial with Advanced sequential pneumatic compression device ("pump" - Flexitouch   Plan Complete Decongestive Therapy (CDT): MLD, compression wraps and/ or garments and devices, skin care, ther ex    Consulted and Agree with Plan of Care Patient           Patient will benefit from skilled therapeutic intervention in order to improve the following deficits and impairments:   Body Structure / Function / Physical Skills: ADL,Decreased knowledge of use of DME,Pain,Edema,IADL,Decreased knowledge of precautions,Skin integrity       Visit  Diagnosis: Lymphedema, not elsewhere classified    Problem List Patient Active Problem List   Diagnosis Date Noted  . Anemia 05/04/2020  . Swelling of limb 05/04/2020  . SVT (supraventricular tachycardia) (HCC) 06/23/2019  . Chronic heartburn 08/27/2018  . Slow transit constipation 08/27/2018  . Other dysphagia 05/24/2017  . Raynaud disease 02/14/2017  . Systemic lupus (HCC) 02/23/2016  . SLE-Sjogren overlap syndrome (HCC) 02/23/2016  . B12 deficiency 02/23/2016  .  Hypertension 02/23/2016  . DDD (degenerative disc disease), lumbar 06/15/2014  . Lumbar radiculitis 06/15/2014  . Midline low back pain without sciatica 04/16/2014  . Lupus pernio 04/16/2014   Loel Dubonnetheresa Alfonse Garringer, MS, OTR/L, [facility] 10/06/20 3:59 PM  [facility] [address] [place], [place], [address] Phone: [phone]   Fax:  [phone]  Name: Natalie Wheeler MRN: [id number] Date of Birth: Sep 30, 1965

## 2020-10-14 ENCOUNTER — Ambulatory Visit: Payer: No Typology Code available for payment source | Attending: Internal Medicine | Admitting: Occupational Therapy

## 2020-10-14 ENCOUNTER — Other Ambulatory Visit: Payer: Self-pay

## 2020-10-14 DIAGNOSIS — I890 Lymphedema, not elsewhere classified: Secondary | ICD-10-CM | POA: Insufficient documentation

## 2020-10-14 NOTE — Therapy (Signed)
[place] [facility] MAIN [facility] SERVICES [address] [place], [place], [address] Phone: [phone]   Fax:  [phone]  Occupational Therapy Treatment  Patient Details  Name: Natalie Wheeler MRN: [id number] Date of Birth: 1965/10/07 Referring Provider (OT): Daniel Nones, MD   Encounter Date: 10/14/2020   OT End of Session - 10/14/20 1629    Visit Number 8    Number of Visits 36    Date for OT Re-Evaluation 11/23/20    Authorization Type [place]    OT Start Time 0305    OT Stop Time 0405    OT Time Calculation (min) 60 min    Equipment Utilized During Treatment advanced Flexitouch sequential pneumatic compression device    Activity Tolerance Patient tolerated treatment well;No increased pain    Behavior During Therapy WFL for tasks assessed/performed           Past Medical History:  Diagnosis Date  . Anemia   . Cervicitis   . Decreased libido   . Degenerative disc disease, lumbar    L1-L5  . Dyspareunia   . Hypertension   . Keratosis pilaris   . Lupus (HCC)   . Lupus (HCC)   . Motion sickness    ships, back seat cars  . Raynaud's disease   . Raynaud's syndrome   . Raynauds syndrome   . Sciatica    right side  . Sjogren's disease (HCC)   . SVT (supraventricular tachycardia) (HCC)    controlled with metoprolol  . URI (upper respiratory infection)    Head cold, started last week. Antibiotic and prenisone taper rx'd on 02/12/17. Improved.  . Vaginal Pap smear, abnormal     Past Surgical History:  Procedure Laterality Date  . BAND HEMORRHOIDECTOMY    . COLONOSCOPY WITH PROPOFOL N/A 08/03/2017   Procedure: COLONOSCOPY WITH PROPOFOL;  Surgeon: Scot Jun, MD;  Location: [facility] ENDOSCOPY;  Service: Endoscopy;  Laterality: N/A;  . LEEP    . NASAL SEPTOPLASTY W/ TURBINOPLASTY Bilateral 02/21/2017   Procedure: NASAL SEPTOPLASTY WITH  INFERIOR TURBINATE REDUCTION;  Surgeon: Bud Face, MD;  Location: [facility];  Service: ENT;   Laterality: Bilateral;  . PRK Bilateral   . TONSILLECTOMY      There were no vitals filed for this visit.   Subjective Assessment - 10/14/20 1631    Subjective  Natalie Wheeler presents for OT visit 8/36 to address BLE swelling. Pt reports LE related pain level unchanged. She does not rate pain numerically today, but reports legs feel tight.    Pertinent History relevant to lymphedema (LE) systemic lupus, htn, Raynaud's Syndrome, hx  Keritosis piaris, DJD L1-L5, supraventricular tachycardia    Limitations Leg swelling and associated  pain limits ability to fit preferred street shoes, ability to perform exercise for health, limits abilty to perform work activities and some leisure pursuits, limits social participation, limits body image (clothing selection)    Repetition Increases Symptoms    Special Tests +Stemmer    Pain Onset More than a month ago                        OT Treatments/Exercises (OP) - 10/14/20 1631      ADLs   ADL Education Given Yes      Manual Therapy   Manual Therapy Edema management    Manual Lymphatic Drainage (MLD) MLD to RLE/RLQ as established to address non-cancer related lymphedema al breathing to activate throacic duct, functional inguinal LN,  then providing proximal to distal J strokes to each lower extremity segment with emphasis on posterior knee LN and "bottleneck" at medial knee. Good tolerance. No increased pain.                  OT Education - 10/14/20 1633    Education Details Continued Pt/ CG edu for lymphedema self care  and home program throughout session. Topics include multilayer, gradient compression wrapping, simple self-MLD, therapeutic lymphatic pumping exercises, skin/nail care, risk reduction factors and LE precautions, compression garments/recommendations and wear and care schedule and compression garment donning / doffing using assistive devices. All questions answered to the Pt's satisfaction, and Pt demonstrates  understanding by report.    Person(s) Educated Patient    Methods Explanation;Demonstration;Handout    Comprehension Verbalized understanding;Returned demonstration;Need further instruction               OT Long Term Goals - 08/24/20 1704      OT LONG TERM GOAL #1   Title Pt will be able to apply knee length, multi-layer, short stretch compression wraps using correct gradient techniques with extra time (modified independence) to return affected limb to premorbid size and shape, to limit leg pain and infection risk, and to improve safe functional ambulation and mobility.    Baseline Max A    Time 4    Period Days    Status New      OT LONG TERM GOAL #2   Title Patient will demonstrate understanding of LE precautions, prevention strategies and cellulitis signs and symptoms by verbalizing at least 4 examples using printed reference with modified assistance to limit infection risk, recurrent wounds and LE progression.    Baseline Max A    Time 4    Period Days    Status New      OT LONG TERM GOAL #3   Title Pt will achieve at least a 5%  limb volume reduction  in both legs during Intensive Phase CDT to prevent re-accumulation of lymphatic congestion and progression of fibrosis, to limit infection risk, to improve functional ambulation and transfers, and  to improve functional performance of basic and instrumental ADLs, and to limit LE progression.    Baseline Max A    Time 12    Period Weeks    Status New    Target Date 11/22/20      OT LONG TERM GOAL #4   Title Pt will achieve and sustain a least 85% compliance performing all daily LE self-care home program components throughout Intensive Phase CDT, including recommended skin care regime, lymphatic pumping ther ex, 23/7 compression wraps and simple self-MLD, to ensure optimal limb volume reduction, to limit infection risk and to limit LE progression.    Baseline Max A    Time 12    Period Weeks    Status New    Target Date  11/22/20      OT LONG TERM GOAL #5   Title By DC from OT Pt will be able to don and doff appropriate daytime compression garments and HOS devices with Max CG assistance to limit lymphatic re-accumulation and LE progression with before transitioning to self-management phase of CDT.    Baseline Max A    Time 12    Period Weeks    Status New    Target Date 11/23/20      Long Term Additional Goals   Additional Long Term Goals Yes      OT LONG TERM GOAL #6  Title Pt will report decrease in lymphedema related leg pain/discomfort from 6/10 to 3/10 (50%) by OT DC to improve functional performance in all occupational domains and to improve quality of life.    Baseline Mod A    Time 12    Period Weeks    Status New    Target Date 11/22/20                 Plan - 10/14/20 1629    Clinical Impression Statement Pt tolerated MLD to RLE in supine without increased pain. Pt continues to demonstrate steady progress towards all goals.Cont as per POC.    OT Occupational Profile and History Comprehensive Assessment- Review of records and extensive additional review of physical, cognitive, psychosocial history related to current functional performance    Occupational performance deficits (Please refer to evaluation for details): ADL's;IADL's;Work;Leisure;Social Participation;Other   body image   Body Structure / Function / Physical Skills ADL;Decreased knowledge of use of DME;Pain;Edema;IADL;Decreased knowledge of precautions;Skin integrity    Rehab Potential Good    Clinical Decision Making Several treatment options, min-mod task modification necessary    Comorbidities Affecting Occupational Performance: Presence of comorbidities impacting occupational performance    Comorbidities impacting occupational performance description: SEE SUBJECTIVE    Modification or Assistance to Complete Evaluation  Min-Moderate modification of tasks or assist with assess necessary to complete eval    OT Frequency 1x  / week   36 weeks   OT Duration Other (comment)    OT Treatment/Interventions Self-care/ADL training;DME and/or AE instruction;Manual lymph drainage;Compression bandaging;Therapeutic activities;Coping strategies training;Therapeutic exercise;Other (comment);Manual Therapy;Patient/family education   fit with appropriate compression garments and devices; coordinate trial with Advanced sequential pneumatic compression device ("pump" - Flexitouch   Plan Complete Decongestive Therapy (CDT): MLD, compression wraps and/ or garments and devices, skin care, ther ex    Consulted and Agree with Plan of Care Patient           Patient will benefit from skilled therapeutic intervention in order to improve the following deficits and impairments:   Body Structure / Function / Physical Skills: ADL,Decreased knowledge of use of DME,Pain,Edema,IADL,Decreased knowledge of precautions,Skin integrity       Visit Diagnosis: Lymphedema, not elsewhere classified    Problem List Patient Active Problem List   Diagnosis Date Noted  . Anemia 05/04/2020  . Swelling of limb 05/04/2020  . SVT (supraventricular tachycardia) (HCC) 06/23/2019  . Chronic heartburn 08/27/2018  . Slow transit constipation 08/27/2018  . Other dysphagia 05/24/2017  . Raynaud disease 02/14/2017  . Systemic lupus (HCC) 02/23/2016  . SLE-Sjogren overlap syndrome (HCC) 02/23/2016  . B12 deficiency 02/23/2016  . Hypertension 02/23/2016  . DDD (degenerative disc disease), lumbar 06/15/2014  . Lumbar radiculitis 06/15/2014  . Midline low back pain without sciatica 04/16/2014  . Lupus pernio 04/16/2014   Loel Dubonnet, MS, OTR/L, [facility] 10/14/20 4:34 PM   [place] [facility] MAIN [facility] SERVICES [address] [place], [place], [address] Phone: [phone]   Fax:  [phone]  Name: Natalie Wheeler MRN: [id number] Date of Birth: 1965/11/25

## 2020-10-21 ENCOUNTER — Ambulatory Visit: Payer: No Typology Code available for payment source | Admitting: Occupational Therapy

## 2020-10-21 ENCOUNTER — Other Ambulatory Visit: Payer: Self-pay

## 2020-10-21 DIAGNOSIS — I890 Lymphedema, not elsewhere classified: Secondary | ICD-10-CM

## 2020-10-21 NOTE — Therapy (Signed)
[place] [facility] MAIN [facility] SERVICES [address] [place], [place], [address] Phone: [phone]   Fax:  [phone]  Occupational Therapy Treatment  Patient Details  Name: Natalie Wheeler MRN: [id number] Date of Birth: 12/14/1965 Referring Provider (OT): Daniel Nones, MD   Encounter Date: 10/21/2020   OT End of Session - 10/21/20 1502     Visit Number 9    Number of Visits 36    Date for OT Re-Evaluation 11/23/20    Authorization Type [place]    OT Start Time 0301    OT Stop Time 0400    OT Time Calculation (min) 59 min    Equipment Utilized During Treatment --    Activity Tolerance Patient tolerated treatment well;No increased pain    Behavior During Therapy WFL for tasks assessed/performed             Past Medical History:  Diagnosis Date   Anemia    Cervicitis    Decreased libido    Degenerative disc disease, lumbar    L1-L5   Dyspareunia    Hypertension    Keratosis pilaris    Lupus (HCC)    Lupus (HCC)    Motion sickness    ships, back seat cars   Raynaud's disease    Raynaud's syndrome    Raynauds syndrome    Sciatica    right side   Sjogren's disease (HCC)    SVT (supraventricular tachycardia) (HCC)    controlled with metoprolol   URI (upper respiratory infection)    Head cold, started last week. Antibiotic and prenisone taper rx'd on 02/12/17. Improved.   Vaginal Pap smear, abnormal     Past Surgical History:  Procedure Laterality Date   BAND HEMORRHOIDECTOMY     COLONOSCOPY WITH PROPOFOL N/A 08/03/2017   Procedure: COLONOSCOPY WITH PROPOFOL;  Surgeon: Scot Jun, MD;  Location: [facility] ENDOSCOPY;  Service: Endoscopy;  Laterality: N/A;   LEEP     NASAL SEPTOPLASTY W/ TURBINOPLASTY Bilateral 02/21/2017   Procedure: NASAL SEPTOPLASTY WITH  INFERIOR TURBINATE REDUCTION;  Surgeon: Bud Face, MD;  Location: [facility];  Service: ENT;  Laterality: Bilateral;   PRK Bilateral    TONSILLECTOMY      There  were no vitals filed for this visit.   Subjective Assessment - 10/21/20 1616     Subjective  Natalie Wheeler presents for OT visit 9/36 to address BLE swelling. Pt reports LE related pain level unchanged. She does not rate pain numerically today. Pt presents with raides red  area on dorsal R arm appearing almost large welt , or insect bite. Revirewed cellulitis precautions.    Pertinent History relevant to lymphedema (LE) systemic lupus, htn, Raynaud's Syndrome, hx  Keritosis piaris, DJD L1-L5, supraventricular tachycardia    Limitations Leg swelling and associated  pain limits ability to fit preferred street shoes, ability to perform exercise for health, limits abilty to perform work activities and some leisure pursuits, limits social participation, limits body image (clothing selection)    Repetition Increases Symptoms    Special Tests +Stemmer    Pain Onset More than a month ago                          OT Treatments/Exercises (OP) - 10/21/20 1617       ADLs   ADL Education Given Yes      Manual Therapy   Manual Therapy Edema management    Manual Lymphatic Drainage (MLD)  MLD to RLE/RLQ as established to address non-cancer related lymphedema al breathing to activate throacic duct, functional inguinal LN, then providing proximal to distal J strokes to each lower extremity segment with emphasis on posterior knee LN and "bottleneck" at medial knee. Good tolerance. No increased pain.                    OT Education - 10/21/20 1618     Education Details Continued Pt/ CG edu for lymphedema self care  and home program throughout session. Topics include multilayer, gradient compression wrapping, simple self-MLD, therapeutic lymphatic pumping exercises, skin/nail care, risk reduction factors and LE precautions, compression garments/recommendations and wear and care schedule and compression garment donning / doffing using assistive devices. All questions answered to the Pt's  satisfaction, and Pt demonstrates understanding by report.    Person(s) Educated Patient    Methods Explanation;Demonstration;Handout    Comprehension Verbalized understanding;Returned demonstration;Need further instruction                 OT Long Term Goals - 08/24/20 1704       OT LONG TERM GOAL #1   Title Pt will be able to apply knee length, multi-layer, short stretch compression wraps using correct gradient techniques with extra time (modified independence) to return affected limb to premorbid size and shape, to limit leg pain and infection risk, and to improve safe functional ambulation and mobility.    Baseline Max A    Time 4    Period Days    Status New      OT LONG TERM GOAL #2   Title Patient will demonstrate understanding of LE precautions, prevention strategies and cellulitis signs and symptoms by verbalizing at least 4 examples using printed reference with modified assistance to limit infection risk, recurrent wounds and LE progression.    Baseline Max A    Time 4    Period Days    Status New      OT LONG TERM GOAL #3   Title Pt will achieve at least a 5%  limb volume reduction  in both legs during Intensive Phase CDT to prevent re-accumulation of lymphatic congestion and progression of fibrosis, to limit infection risk, to improve functional ambulation and transfers, and  to improve functional performance of basic and instrumental ADLs, and to limit LE progression.    Baseline Max A    Time 12    Period Weeks    Status New    Target Date 11/22/20      OT LONG TERM GOAL #4   Title Pt will achieve and sustain a least 85% compliance performing all daily LE self-care home program components throughout Intensive Phase CDT, including recommended skin care regime, lymphatic pumping ther ex, 23/7 compression wraps and simple self-MLD, to ensure optimal limb volume reduction, to limit infection risk and to limit LE progression.    Baseline Max A    Time 12    Period  Weeks    Status New    Target Date 11/22/20      OT LONG TERM GOAL #5   Title By DC from OT Pt will be able to don and doff appropriate daytime compression garments and HOS devices with Max CG assistance to limit lymphatic re-accumulation and LE progression with before transitioning to self-management phase of CDT.    Baseline Max A    Time 12    Period Weeks    Status New    Target Date 11/23/20  Long Term Additional Goals   Additional Long Term Goals Yes      OT LONG TERM GOAL #6   Title Pt will report decrease in lymphedema related leg pain/discomfort from 6/10 to 3/10 (50%) by OT DC to improve functional performance in all occupational domains and to improve quality of life.    Baseline Mod A    Time 12    Period Weeks    Status New    Target Date 11/22/20                   Plan - 10/21/20 1615     Clinical Impression Statement Pt tolerated MLD to RLE in supine without increased pain. Pt continues to demonstrate steady progress towards all goals.Cont as per POC.    OT Occupational Profile and History Comprehensive Assessment- Review of records and extensive additional review of physical, cognitive, psychosocial history related to current functional performance    Occupational performance deficits (Please refer to evaluation for details): ADL's;IADL's;Work;Leisure;Social Participation;Other   body image   Body Structure / Function / Physical Skills ADL;Decreased knowledge of use of DME;Pain;Edema;IADL;Decreased knowledge of precautions;Skin integrity    Rehab Potential Good    Clinical Decision Making Several treatment options, min-mod task modification necessary    Comorbidities Affecting Occupational Performance: Presence of comorbidities impacting occupational performance    Comorbidities impacting occupational performance description: SEE SUBJECTIVE    Modification or Assistance to Complete Evaluation  Min-Moderate modification of tasks or assist with assess  necessary to complete eval    OT Frequency 1x / week   36 weeks   OT Duration Other (comment)    OT Treatment/Interventions Self-care/ADL training;DME and/or AE instruction;Manual lymph drainage;Compression bandaging;Therapeutic activities;Coping strategies training;Therapeutic exercise;Other (comment);Manual Therapy;Patient/family education   fit with appropriate compression garments and devices; coordinate trial with Advanced sequential pneumatic compression device ("pump" - Flexitouch   Plan Complete Decongestive Therapy (CDT): MLD, compression wraps and/ or garments and devices, skin care, ther ex    Consulted and Agree with Plan of Care Patient             Patient will benefit from skilled therapeutic intervention in order to improve the following deficits and impairments:   Body Structure / Function / Physical Skills: ADL, Decreased knowledge of use of DME, Pain, Edema, IADL, Decreased knowledge of precautions, Skin integrity       Visit Diagnosis: Lymphedema, not elsewhere classified    Problem List Patient Active Problem List   Diagnosis Date Noted   Anemia 05/04/2020   Swelling of limb 05/04/2020   SVT (supraventricular tachycardia) (HCC) 06/23/2019   Chronic heartburn 08/27/2018   Slow transit constipation 08/27/2018   Other dysphagia 05/24/2017   Raynaud disease 02/14/2017   Systemic lupus (HCC) 02/23/2016   SLE-Sjogren overlap syndrome (HCC) 02/23/2016   B12 deficiency 02/23/2016   Hypertension 02/23/2016   DDD (degenerative disc disease), lumbar 06/15/2014   Lumbar radiculitis 06/15/2014   Midline low back pain without sciatica 04/16/2014   Lupus pernio 04/16/2014    Loel Dubonnet, MS, OTR/L, [facility] 10/21/20 4:19 PM   [place] [facility] MAIN [facility] SERVICES [address] [place], [place], [address] Phone: [phone]   Fax:  [phone]  Name: Natalie Wheeler MRN: [id number] Date of Birth: 04-09-1966

## 2020-10-22 ENCOUNTER — Other Ambulatory Visit: Payer: Self-pay

## 2020-10-22 MED ORDER — DOXYCYCLINE HYCLATE 100 MG PO CAPS
100.0000 mg | ORAL_CAPSULE | Freq: Two times a day (BID) | ORAL | 0 refills | Status: DC
  Filled 2020-10-22: qty 14, 7d supply, fill #0

## 2020-10-22 MED ORDER — METOPROLOL TARTRATE 25 MG PO TABS
ORAL_TABLET | ORAL | 1 refills | Status: DC
  Filled 2020-10-22: qty 90, 90d supply, fill #0
  Filled 2021-01-18: qty 90, 90d supply, fill #1

## 2020-10-22 MED ORDER — CEPHALEXIN 500 MG PO CAPS
ORAL_CAPSULE | ORAL | 0 refills | Status: DC
  Filled 2020-10-22 (×2): qty 21, 7d supply, fill #0

## 2020-10-22 MED FILL — Diclofenac Potassium Tab 50 MG: ORAL | 90 days supply | Qty: 180 | Fill #0 | Status: AC

## 2020-10-26 ENCOUNTER — Other Ambulatory Visit: Payer: Self-pay

## 2020-10-26 ENCOUNTER — Ambulatory Visit (INDEPENDENT_AMBULATORY_CARE_PROVIDER_SITE_OTHER): Payer: No Typology Code available for payment source | Admitting: Vascular Surgery

## 2020-10-26 VITALS — BP 147/81 | HR 76 | Resp 16 | Wt 146.0 lb

## 2020-10-26 DIAGNOSIS — R600 Localized edema: Secondary | ICD-10-CM | POA: Diagnosis not present

## 2020-10-26 DIAGNOSIS — I890 Lymphedema, not elsewhere classified: Secondary | ICD-10-CM | POA: Insufficient documentation

## 2020-10-26 DIAGNOSIS — I10 Essential (primary) hypertension: Secondary | ICD-10-CM | POA: Diagnosis not present

## 2020-10-26 DIAGNOSIS — M7989 Other specified soft tissue disorders: Secondary | ICD-10-CM

## 2020-10-26 DIAGNOSIS — I7300 Raynaud's syndrome without gangrene: Secondary | ICD-10-CM | POA: Diagnosis not present

## 2020-10-26 NOTE — Progress Notes (Signed)
MRN : [id number]  Natalie Wheeler is a 55 y.o. (04-20-1966) female who presents with chief complaint of  Chief Complaint  Patient presents with   Follow-up    6 month follow up  .  History of Present Illness: Patient returns today in follow up of her leg swelling and lymphedema.  She has been regularly wearing her 20 to 30 mmHg compression stockings and elevating her legs.  Her job requires her to sit and this has made things more difficult.  Both legs are affected.  She has been going for manual lymphatic massage once a week and this does help, but the inability to do this daily has been problematic.  She has been informed about the lymphedema pump which would clearly be an excellent adjuvant therapy to help her symptoms.  No new ulceration or infection.  Her legs are very tired and heavy most every day.  Symptoms are worse at night.  Current Outpatient Medications  Medication Sig Dispense Refill   azelastine (ASTELIN) 0.1 % nasal spray Place into both nostrils.     azelastine (ASTELIN) 0.1 % nasal spray Place into both nostrils. 30 mL 11   baclofen (LIORESAL) 20 MG tablet Take 20 mg by mouth as needed for muscle spasms.     baclofen (LIORESAL) 20 MG tablet TAKE 1 TABLET BY MOUTH 3 TIMES DAILY AS NEEDED 60 tablet 11   calcium-vitamin D (OSCAL WITH D) 250-125 MG-UNIT tablet Take 1 tablet by mouth daily.     cephALEXin (KEFLEX) 500 MG capsule Take 1 capsule (500 mg total) by mouth 3 (three) times daily for 7 days 21 capsule 0   cyanocobalamin 1000 MCG tablet Take by mouth.     cycloSPORINE (RESTASIS) 0.05 % ophthalmic emulsion Place 1 drop into both eyes 2 (two) times daily.     diclofenac (CATAFLAM) 50 MG tablet Take 50 mg by mouth 2 (two) times daily.     diclofenac (CATAFLAM) 50 MG tablet TAKE 1 TABLET (50 MG TOTAL) BY MOUTH 2 (TWO) TIMES DAILY WITH MEALS 180 tablet 3   diclofenac (CATAFLAM) 50 MG tablet TAKE 1 TABLET BY MOUTH TWO TIMES DAILY 60 tablet 11   estradiol (ESTRACE) 0.1 MG/GM  vaginal cream PLACE 1 APPLICATORFUL (2GM) VAGINALLY AT BEDTIME 42.5 g 12   hydroxychloroquine (PLAQUENIL) 200 MG tablet Take by mouth.     linaclotide (LINZESS) 290 MCG CAPS capsule Take by mouth.     linaclotide (LINZESS) 290 MCG CAPS capsule Take 1 capsule (290 mcg total) by mouth once daily 90 capsule 3   loratadine (CLARITIN) 10 MG tablet Take 10 mg by mouth daily.     metoprolol tartrate (LOPRESSOR) 25 MG tablet Take 25 mg by mouth daily.     metoprolol tartrate (LOPRESSOR) 25 MG tablet TAKE 1/2 TABLET (12.5 MG TOTAL) BY MOUTH 2 TIMES DAILY 90 tablet 1   Multiple Vitamin (MULTIVITAMIN) tablet Take 1 tablet by mouth daily.     pantoprazole (PROTONIX) 40 MG tablet Take by mouth.     pantoprazole (PROTONIX) 40 MG tablet TAKE 1 TABLET (40 MG TOTAL) BY MOUTH ONCE DAILY 90 tablet 1   progesterone (PROMETRIUM) 200 MG capsule Take 1 capsule (200 mg total) by mouth daily. 90 capsule 0   progesterone (PROMETRIUM) 200 MG capsule TAKE 1 CAPSULE (200 MG TOTAL) BY MOUTH AT BEDTIME. 90 capsule 3   cetirizine (ZYRTEC) 10 MG tablet Take 10 mg by mouth daily. (Patient not taking: No sig reported)  doxycycline (VIBRAMYCIN) 100 MG capsule Take 1 capsule (100 mg total) by mouth 2 (two) times daily for 7 days (Patient not taking: Reported on 10/26/2020) 14 capsule 0   linaclotide (LINZESS) 290 MCG CAPS capsule TAKE 1 CAPSULE (290 MCG TOTAL) BY MOUTH ONCE DAILY 90 capsule 3   scopolamine (TRANSDERM-SCOP) 1 MG/3DAYS PLACE 1 PATCH BEHIND THE EAR EVERY THIRD DAY AS NEEDED AT LEAST 4 HOURS BEFORE EXPOSURE 4 patch 1   No current facility-administered medications for this visit.    Past Medical History:  Diagnosis Date   Anemia    Cervicitis    Decreased libido    Degenerative disc disease, lumbar    L1-L5   Dyspareunia    Hypertension    Keratosis pilaris    Lupus (HCC)    Lupus (HCC)    Motion sickness    ships, back seat cars   Raynaud's disease    Raynaud's syndrome    Raynauds syndrome     Sciatica    right side   Sjogren's disease (HCC)    SVT (supraventricular tachycardia) (HCC)    controlled with metoprolol   URI (upper respiratory infection)    Head cold, started last week. Antibiotic and prenisone taper rx'd on 02/12/17. Improved.   Vaginal Pap smear, abnormal     Past Surgical History:  Procedure Laterality Date   BAND HEMORRHOIDECTOMY     COLONOSCOPY WITH PROPOFOL N/A 08/03/2017   Procedure: COLONOSCOPY WITH PROPOFOL;  Surgeon: Scot Jun, MD;  Location: [facility] ENDOSCOPY;  Service: Endoscopy;  Laterality: N/A;   LEEP     NASAL SEPTOPLASTY W/ TURBINOPLASTY Bilateral 02/21/2017   Procedure: NASAL SEPTOPLASTY WITH  INFERIOR TURBINATE REDUCTION;  Surgeon: Bud Face, MD;  Location: [facility];  Service: ENT;  Laterality: Bilateral;   PRK Bilateral    TONSILLECTOMY       Social History   Tobacco Use   Smoking status: Former    Pack years: 0.00    Types: Cigarettes    Quit date: 1993    Years since quitting: 29.4   Smokeless tobacco: Never  Vaping Use   Vaping Use: Never used  Substance Use Topics   Alcohol use: Yes    Alcohol/week: 3.0 standard drinks    Types: 3 Glasses of wine per week    Comment: none last 24hrs   Drug use: No       Family History  Problem Relation Age of Onset   Breast cancer Paternal Aunt 9   Diabetes Maternal Grandmother    Heart disease Maternal Grandfather    Heart disease Paternal Grandmother    Cancer Maternal Aunt        ovarian   Cirrhosis Father      Allergies  Allergen Reactions   Floxin [Ofloxacin] Shortness Of Breath   Buspirone Nausea Only   Ciprofloxacin    Doxycycline    Mobic [Meloxicam]    Nifedipine     Hot flashes   Sulfadiazine    Tramadol Nausea Only   Zofran [Ondansetron Hcl]    Sulfa Antibiotics Rash    REVIEW OF SYSTEMS (Negative unless checked)   Constitutional: [] Weight loss  [] Fever  [] Chills Cardiac: [] Chest pain   [] Chest pressure   [] Palpitations    [] Shortness of breath when laying flat   [] Shortness of breath at rest   [] Shortness of breath with exertion. Vascular:  [x] Pain in legs with walking   [] Pain in legs at rest   [] Pain in legs when laying  flat   [] Claudication   [] Pain in feet when walking  [] Pain in feet at rest  [] Pain in feet when laying flat   [] History of DVT   [] Phlebitis   [x] Swelling in legs   [] Varicose veins   [] Non-healing ulcers Pulmonary:   [] Uses home oxygen   [] Productive cough   [] Hemoptysis   [] Wheeze  [] COPD   [] Asthma Neurologic:  [] Dizziness  [] Blackouts   [] Seizures   [] History of stroke   [] History of TIA  [] Aphasia   [] Temporary blindness   [] Dysphagia   [] Weakness or numbness in arms   [] Weakness or numbness in legs Musculoskeletal:  [x] Arthritis   [] Joint swelling   [] Joint pain   [x] Low back pain Hematologic:  [] Easy bruising  [] Easy bleeding   [] Hypercoagulable state   [x] Anemic  [] Hepatitis Gastrointestinal:  [] Blood in stool   [] Vomiting blood  [] Gastroesophageal reflux/heartburn   [] Abdominal pain Genitourinary:  [] Chronic kidney disease   [] Difficult urination  [] Frequent urination  [] Burning with urination   [] Hematuria Skin:  [] Rashes   [] Ulcers   [] Wounds Psychological:  [] History of anxiety   []  History of major depression.  Physical Examination  BP (!) 147/81 (BP Location: Right Arm)   Pulse 76   Resp 16   Wt 146 lb (66.2 kg)   BMI 23.93 kg/m  Gen:  WD/WN, NAD Head: [place]/AT, No temporalis wasting. Ear/Nose/Throat: Hearing grossly intact, nares w/o erythema or drainage Eyes: Conjunctiva clear. Sclera non-icteric Neck: Supple.  Trachea midline Pulmonary:  Good air movement, no use of accessory muscles.  Cardiac: RRR, no JVD Vascular:  Vessel Right Left  Radial Palpable Palpable                          PT Palpable Palpable  DP Palpable Palpable   Gastrointestinal: soft, non-tender/non-distended. No guarding/reflex.  Musculoskeletal: M/S 5/5 throughout.  No deformity or atrophy.   1+ bilateral lower extremity edema. Neurologic: Sensation grossly intact in extremities.  Symmetrical.  Speech is fluent.  Psychiatric: Judgment intact, Mood & affect appropriate for pt's clinical situation. Dermatologic: No rashes or ulcers noted.  No cellulitis or open wounds.      Labs No results found for this or any previous visit (from the past 2160 hour(s)).  Radiology No results found.  Assessment/Plan Hypertension blood pressure control important in reducing the progression of atherosclerotic disease. On appropriate oral medications.     Raynaud disease Bothersome but not debilitating  Lymphedema The patient has chronic lymphedema which would be stage II with swelling refractory to compression, elevation, and exercise.  She has been using appropriate conservative therapies for about 6 months now.  She is getting manual lymphatic massage, but the infrequency of this therapy makes it less helpful than a lymphedema pump would.  She would clearly benefit from a lymphedema pump as her symptoms of been going on for many years and gradually getting worse.  We will try to help get this going and approved in the next several weeks.  I will plan to see her back in 6 months.    , MD  10/26/2020 4:20 PM    This note was created with Dragon medical transcription system.  Any errors from dictation are purely unintentional

## 2020-10-26 NOTE — Assessment & Plan Note (Signed)
The patient has chronic lymphedema which would be stage II with swelling refractory to compression, elevation, and exercise.  She has been using appropriate conservative therapies for about 6 months now.  She is getting manual lymphatic massage, but the infrequency of this therapy makes it less helpful than a lymphedema pump would.  She would clearly benefit from a lymphedema pump as her symptoms of been going on for many years and gradually getting worse.  We will try to help get this going and approved in the next several weeks.  I will plan to see her back in 6 months.

## 2020-10-28 ENCOUNTER — Other Ambulatory Visit: Payer: Self-pay

## 2020-10-28 ENCOUNTER — Ambulatory Visit: Payer: No Typology Code available for payment source | Admitting: Occupational Therapy

## 2020-10-28 DIAGNOSIS — I890 Lymphedema, not elsewhere classified: Secondary | ICD-10-CM

## 2020-10-28 NOTE — Therapy (Signed)
[facility] MAIN [facility] SERVICES [address] [place], [place], [address] Phone: [phone]   Fax:  [phone]  Occupational Therapy Treatment  Patient Details  Name: Natalie Wheeler MRN: [id number] Date of Birth: 1965-11-24 Referring Provider (OT): Daniel Nones, MD   Encounter Date: 10/28/2020   OT End of Session - 10/28/20 1608     Visit Number 10    Number of Visits 36    Date for OT Re-Evaluation 11/23/20    Authorization Type [place]    OT Start Time 0300    OT Stop Time 0407    OT Time Calculation (min) 67 min             Past Medical History:  Diagnosis Date   Anemia    Cervicitis    Decreased libido    Degenerative disc disease, lumbar    L1-L5   Dyspareunia    Hypertension    Keratosis pilaris    Lupus (HCC)    Lupus (HCC)    Motion sickness    ships, back seat cars   Raynaud's disease    Raynaud's syndrome    Raynauds syndrome    Sciatica    right side   Sjogren's disease (HCC)    SVT (supraventricular tachycardia) (HCC)    controlled with metoprolol   URI (upper respiratory infection)    Head cold, started last week. Antibiotic and prenisone taper rx'd on 02/12/17. Improved.   Vaginal Pap smear, abnormal     Past Surgical History:  Procedure Laterality Date   BAND HEMORRHOIDECTOMY     COLONOSCOPY WITH PROPOFOL N/A 08/03/2017   Procedure: COLONOSCOPY WITH PROPOFOL;  Surgeon: Scot Jun, MD;  Location: [facility] ENDOSCOPY;  Service: Endoscopy;  Laterality: N/A;   LEEP     NASAL SEPTOPLASTY W/ TURBINOPLASTY Bilateral 02/21/2017   Procedure: NASAL SEPTOPLASTY WITH  INFERIOR TURBINATE REDUCTION;  Surgeon: Bud Face, MD;  Location: [facility];  Service: ENT;  Laterality: Bilateral;   PRK Bilateral    TONSILLECTOMY      There were no vitals filed for this visit.   Subjective Assessment - 10/28/20 1614     Subjective  Natalie Wheeler presents for OT visit 10/36 to address BLE swelling. Pt reports LE  related pain level unchanged. She does not rate pain numerically today. Pt has no new concerns. Pt reports she saw her vascular doctor and he agreed that Flexitouch is good idea for long term LE management and symptom relief. Still no word from vendor on insurance authorization.    Pertinent History relevant to lymphedema (LE) systemic lupus, htn, Raynaud's Syndrome, hx  Keritosis piaris, DJD L1-L5, supraventricular tachycardia    Limitations Leg swelling and associated  pain limits ability to fit preferred street shoes, ability to perform exercise for health, limits abilty to perform work activities and some leisure pursuits, limits social participation, limits body image (clothing selection)    Repetition Increases Symptoms    Special Tests +Stemmer    Pain Onset More than a month ago                          OT Treatments/Exercises (OP) - 10/28/20 1613       ADLs   ADL Education Given Yes      Manual Therapy   Manual Therapy Edema management    Manual Lymphatic Drainage (MLD) MLD to RLE/RLQ as established to address non-cancer related lymphedema al breathing to activate throacic  duct, functional inguinal LN, then providing proximal to distal J strokes to each lower extremity segment with emphasis on posterior knee LN and "bottleneck" at medial knee. Good tolerance. No increased pain.                    OT Education - 10/28/20 1615     Education Details Continued Pt/ CG edu for lymphedema self care  and home program throughout session. Topics include multilayer, gradient compression wrapping, simple self-MLD, therapeutic lymphatic pumping exercises, skin/nail care, risk reduction factors and LE precautions, compression garments/recommendations and wear and care schedule and compression garment donning / doffing using assistive devices. All questions answered to the Pt's satisfaction, and Pt demonstrates understanding by report.    Person(s) Educated Patient     Methods Explanation;Demonstration;Handout    Comprehension Verbalized understanding;Returned demonstration;Need further instruction                 OT Long Term Goals - 10/28/20 1610       OT LONG TERM GOAL #1   Title Pt will be able to apply knee length, multi-layer, short stretch compression wraps using correct gradient techniques with extra time (modified independence) to return affected limb to premorbid size and shape, to limit leg pain and infection risk, and to improve safe functional ambulation and mobility.    Baseline Max A    Time 4    Period Days    Status Deferred   DC goal. Pt utilizing ccl 1 (20-30 mmHg) knee high compression [id number] daily with good results for controlling swelling     OT LONG TERM GOAL #2   Title Patient will demonstrate understanding of LE precautions, prevention strategies and cellulitis signs and symptoms by verbalizing at least 4 examples using printed reference with modified assistance to limit infection risk, recurrent wounds and LE progression.    Baseline Max A    Time 4    Period Days    Status Achieved      OT LONG TERM GOAL #3   Title Pt will achieve at least a 5%  limb volume reduction  in both legs during Intensive Phase CDT to prevent re-accumulation of lymphatic congestion and progression of fibrosis, to limit infection risk, to improve functional ambulation and transfers, and  to improve functional performance of basic and instrumental ADLs, and to limit LE progression.    Baseline Max A    Time 12    Period Weeks    Status Deferred      OT LONG TERM GOAL #4   Title Pt will achieve and sustain a least 85% compliance performing all daily LE self-care home program components throughout Intensive Phase CDT, including recommended skin care regime, lymphatic pumping ther ex, 23/7 compression wraps and simple self-MLD, to ensure optimal limb volume reduction, to limit infection risk and to limit LE progression.    Baseline Max A     Time 12    Period Weeks    Status Achieved      OT LONG TERM GOAL #5   Title By DC from OT Pt will be able to don and doff appropriate daytime compression garments and HOS devices with Max CG assistance to limit lymphatic re-accumulation and LE progression with before transitioning to self-management phase of CDT.    Baseline Max A    Time 12    Period Weeks    Status Achieved      OT LONG TERM GOAL #6   Title Pt  will report decrease in lymphedema related leg pain/discomfort from 6/10 to 3/10 (50%) by OT DC to improve functional performance in all occupational domains and to improve quality of life.    Baseline Mod A    Time 12    Period Weeks    Status On-going    Target Date 11/22/20                   Plan - 10/28/20 1609     Clinical Impression Statement MLD to LLE/LLQ in supine as established using non-cancer LE pathways. Pt tolerated well without increased pain. Reviewed progress towards OT goals. See Long Term Goals section for progress todate. Cont as per POC.    OT Occupational Profile and History Comprehensive Assessment- Review of records and extensive additional review of physical, cognitive, psychosocial history related to current functional performance    Occupational performance deficits (Please refer to evaluation for details): ADL's;IADL's;Work;Leisure;Social Participation;Other   body image   Body Structure / Function / Physical Skills ADL;Decreased knowledge of use of DME;Pain;Edema;IADL;Decreased knowledge of precautions;Skin integrity    Rehab Potential Good    Clinical Decision Making Several treatment options, min-mod task modification necessary    Comorbidities Affecting Occupational Performance: Presence of comorbidities impacting occupational performance    Comorbidities impacting occupational performance description: SEE SUBJECTIVE    Modification or Assistance to Complete Evaluation  Min-Moderate modification of tasks or assist with assess necessary  to complete eval    OT Frequency 1x / week   36 weeks   OT Duration Other (comment)    OT Treatment/Interventions Self-care/ADL training;DME and/or AE instruction;Manual lymph drainage;Compression bandaging;Therapeutic activities;Coping strategies training;Therapeutic exercise;Other (comment);Manual Therapy;Patient/family education   fit with appropriate compression garments and devices; coordinate trial with Advanced sequential pneumatic compression device ("pump" - Flexitouch   Plan Complete Decongestive Therapy (CDT): MLD, compression wraps and/ or garments and devices, skin care, ther ex    Consulted and Agree with Plan of Care Patient             Patient will benefit from skilled therapeutic intervention in order to improve the following deficits and impairments:   Body Structure / Function / Physical Skills: ADL, Decreased knowledge of use of DME, Pain, Edema, IADL, Decreased knowledge of precautions, Skin integrity       Visit Diagnosis: Lymphedema, not elsewhere classified    Problem List Patient Active Problem List   Diagnosis Date Noted   Lymphedema 10/26/2020   Anemia 05/04/2020   Swelling of limb 05/04/2020   SVT (supraventricular tachycardia) (HCC) 06/23/2019   Chronic heartburn 08/27/2018   Slow transit constipation 08/27/2018   Other dysphagia 05/24/2017   Raynaud disease 02/14/2017   Systemic lupus (HCC) 02/23/2016   SLE-Sjogren overlap syndrome (HCC) 02/23/2016   B12 deficiency 02/23/2016   Hypertension 02/23/2016   DDD (degenerative disc disease), lumbar 06/15/2014   Lumbar radiculitis 06/15/2014   Midline low back pain without sciatica 04/16/2014   Lupus pernio 04/16/2014    Natalie Dubonnet, MS, OTR/L, CLT-LANA 10/28/20 4:16 PM    [place] [facility] MAIN [facility] SERVICES [address] [place], [place], [address] Phone: [phone]   Fax:  [phone]  Name: Natalie Wheeler MRN: [id number] Date of Birth:  March 01, 1966

## 2020-10-28 NOTE — Patient Instructions (Signed)
Lymphedema Self- Care Instructions  1. EXERCISE: Perform lymphatic pumping there ex 2 x a day. While wearing your compression wraps or garments. Perform 10 reps of each exercise bilaterally and be sure to perform them in order. Don;t skip around!  OMIT PARTIAL SIT UP  2. MLD: Perform simple self-Manual Lymphatic Drainage (MLD) at least once a day as directed.  3. WRAPS: Compression wraps are to be worn 23 hrs/ 7 days/wk during Intensive Phase of Complete Decongestive Therapy (CDT).Building tolerance may take time and practice, so don't get discouraged. If bandages begin to feel tight during periods of inactivity and/or during the night, try performing your exercises to loosen them.   4. GARMENTS: During Management Phase CDT your compression garments are to be worn during waking hours when active. Do NOT sleep in your garments!!   5. PUT YOUR FEET UP! Elevate your feet and legs and feet to the level of your heart whenever you are sitting down.   6. SKIN: Carefully monitor skin condition and perform impeccable hygiene daily. Bathe skin with mild soap and water and apply low pH lotion (aka Eucerin ) to improve hydration and limit infection risk.  

## 2020-11-04 ENCOUNTER — Ambulatory Visit: Payer: No Typology Code available for payment source | Admitting: Occupational Therapy

## 2020-11-04 ENCOUNTER — Other Ambulatory Visit: Payer: Self-pay

## 2020-11-04 DIAGNOSIS — I890 Lymphedema, not elsewhere classified: Secondary | ICD-10-CM | POA: Diagnosis not present

## 2020-11-05 NOTE — Patient Instructions (Signed)
Lymphedema Self- Care Instructions  1. EXERCISE: Perform lymphatic pumping there ex 2 x a day. While wearing your compression wraps or garments. Perform 10 reps of each exercise bilaterally and be sure to perform them in order. Don;t skip around!  OMIT PARTIAL SIT UP  2. MLD: Perform simple self-Manual Lymphatic Drainage (MLD) at least once a day as directed.  3. WRAPS: Compression wraps are to be worn 23 hrs/ 7 days/wk during Intensive Phase of Complete Decongestive Therapy (CDT).Building tolerance may take time and practice, so don't get discouraged. If bandages begin to feel tight during periods of inactivity and/or during the night, try performing your exercises to loosen them.   4. GARMENTS: During Management Phase CDT your compression garments are to be worn during waking hours when active. Do NOT sleep in your garments!!   5. PUT YOUR FEET UP! Elevate your feet and legs and feet to the level of your heart whenever you are sitting down.   6. SKIN: Carefully monitor skin condition and perform impeccable hygiene daily. Bathe skin with mild soap and water and apply low pH lotion (aka Eucerin ) to improve hydration and limit infection risk.  

## 2020-11-05 NOTE — Therapy (Signed)
[place] [facility] MAIN [facility] SERVICES [address] [place], [place], [address] Phone: [phone]   Fax:  [phone]  Occupational Therapy Treatment  Patient Details  Name: Natalie Wheeler MRN: [id number] Date of Birth: 1965/07/23 Referring Provider (OT): Daniel Nones, MD   Encounter Date: 11/04/2020   OT End of Session - 11/05/20 1012     Visit Number 11    Number of Visits 36    Date for OT Re-Evaluation 11/23/20    Authorization Type [place]    OT Start Time 0300    OT Stop Time 0405    OT Time Calculation (min) 65 min    Activity Tolerance Patient tolerated treatment well;No increased pain    Behavior During Therapy WFL for tasks assessed/performed             Past Medical History:  Diagnosis Date   Anemia    Cervicitis    Decreased libido    Degenerative disc disease, lumbar    L1-L5   Dyspareunia    Hypertension    Keratosis pilaris    Lupus (HCC)    Lupus (HCC)    Motion sickness    ships, back seat cars   Raynaud's disease    Raynaud's syndrome    Raynauds syndrome    Sciatica    right side   Sjogren's disease (HCC)    SVT (supraventricular tachycardia) (HCC)    controlled with metoprolol   URI (upper respiratory infection)    Head cold, started last week. Antibiotic and prenisone taper rx'd on 02/12/17. Improved.   Vaginal Pap smear, abnormal     Past Surgical History:  Procedure Laterality Date   BAND HEMORRHOIDECTOMY     COLONOSCOPY WITH PROPOFOL N/A 08/03/2017   Procedure: COLONOSCOPY WITH PROPOFOL;  Surgeon: Scot Jun, MD;  [facility] END[facility]COPY;  Service: Endoscopy;  Laterality: N/A;   LEEP     NASAL SEPTOPLASTY W/ TURBINOPLASTY Bilateral 02/21/2017   Procedure: NASAL SEPTOPLASTY WITH  INFERIOR TURBINATE REDUCTION;  Surgeon: Bud Face, MD;  Location[facility];  Service: ENT;  Laterality: Bilateral;   PRK Bilateral    TONSILLECTOMY      There were no vitals filed for this visit.    Subjective Assessment - 11/05/20 1015     Subjective  Natalie Wheeler presents for OT visit 11/36 to address BLE swelling. Pt reports LE related pain level unchanged. She does not rate pain numerically today. Pt has no new concerns. Pt reports she saw her vascular doctor and he agreed that Flexitouch is good idea for long term LE management and symptom relief. Still no word from vendor on insurance authorization.    Pertinent History relevant to lymphedema (LE) systemic lupus, htn, Raynaud's Syndrome, hx  Keritosis piaris, DJD L1-L5, supraventricular tachycardia    Limitations Leg swelling and associated  pain limits ability to fit preferred street shoes, ability to perform exercise for health, limits abilty to perform work activities and some leisure pursuits, limits social participation, limits body image (clothing selection)    Repetition Increases Symptoms    Special Tests +Stemmer    Pain Onset More than a month ago                          OT Treatments/Exercises (OP) - 11/05/20 1016       ADLs   ADL Education Given Yes      Manual Therapy   Manual Therapy Edema management  Manual Lymphatic Drainage (MLD) MLD to LLE/LLQ as established to address non-cancer related lymphedema al breathing to activate throacic duct, functional inguinal LN, then providing proximal to distal J strokes to each lower extremity segment with emphasis on posterior knee LN and "bottleneck" at medial knee. Good tolerance. No increased pain.                    OT Education - 11/05/20 1022     Education Details Continued Pt/ CG edu for lymphedema self care  and home program throughout session. Topics include multilayer, gradient compression wrapping, simple self-MLD, therapeutic lymphatic pumping exercises, skin/nail care, risk reduction factors and LE precautions, compression garments/recommendations and wear and care schedule and compression garment donning / doffing using assistive devices. All  questions answered to the Pt's satisfaction, and Pt demonstrates understanding by report.    Person(s) Educated Patient    Methods Explanation;Demonstration;Handout    Comprehension Verbalized understanding;Returned demonstration;Need further instruction                 OT Long Term Goals - 10/28/20 1610       OT LONG TERM GOAL #1   Title Pt will be able to apply knee length, multi-layer, short stretch compression wraps using correct gradient techniques with extra time (modified independence) to return affected limb to premorbid size and shape, to limit leg pain and infection risk, and to improve safe functional ambulation and mobility.    Baseline Max A    Time 4    Period Days    Status Deferred   DC goal. Pt utilizing ccl 1 (20-30 mmHg) knee high compression garmen6ts daily with good results for controlling swelling     OT LONG TERM GOAL #2   Title Patient will demonstrate understanding of LE precautions, prevention strategies and cellulitis signs and symptoms by verbalizing at least 4 examples using printed reference with modified assistance to limit infection risk, recurrent wounds and LE progression.    Baseline Max A    Time 4    Period Days    Status Achieved      OT LONG TERM GOAL #3   Title Pt will achieve at least a 5%  limb volume reduction  in both legs during Intensive Phase CDT to prevent re-accumulation of lymphatic congestion and progression of fibrosis, to limit infection risk, to improve functional ambulation and transfers, and  to improve functional performance of basic and instrumental ADLs, and to limit LE progression.    Baseline Max A    Time 12    Period Weeks    Status Deferred      OT LONG TERM GOAL #4   Title Pt will achieve and sustain a least 85% compliance performing all daily LE self-care home program components throughout Intensive Phase CDT, including recommended skin care regime, lymphatic pumping ther ex, 23/7 compression wraps and simple  self-MLD, to ensure optimal limb volume reduction, to limit infection risk and to limit LE progression.    Baseline Max A    Time 12    Period Weeks    Status Achieved      OT LONG TERM GOAL #5   Title By DC from OT Pt will be able to don and doff appropriate daytime compression garments and HOS devices with Max CG assistance to limit lymphatic re-accumulation and LE progression with before transitioning to self-management phase of CDT.    Baseline Max A    Time 12    Period Weeks  Status Achieved      OT LONG TERM GOAL #6   Title Pt will report decrease in lymphedema related leg pain/discomfort from 6/10 to 3/10 (50%) by OT DC to improve functional performance in all occupational domains and to improve quality of life.    Baseline Mod A    Time 12    Period Weeks    Status On-going    Target Date 11/22/20                   Plan - 11/05/20 1013     Clinical Impression Statement Pt tolerated MLD and skin care to LLE/LLQ without increased pain. Cont as per POC.    OT Occupational Profile and History Comprehensive Assessment- Review of records and extensive additional review of physical, cognitive, psychosocial history related to current functional performance    Occupational performance deficits (Please refer to evaluation for details): ADL's;IADL's;Work;Leisure;Social Participation;Other   body image   Body Structure / Function / Physical Skills ADL;Decreased knowledge of use of DME;Pain;Edema;IADL;Decreased knowledge of precautions;Skin integrity    Rehab Potential Good    Clinical Decision Making Several treatment options, min-mod task modification necessary    Comorbidities Affecting Occupational Performance: Presence of comorbidities impacting occupational performance    Comorbidities impacting occupational performance description: SEE SUBJECTIVE    Modification or Assistance to Complete Evaluation  Min-Moderate modification of tasks or assist with assess necessary to  complete eval    OT Frequency 1x / week   36 weeks   OT Duration Other (comment)    OT Treatment/Interventions Self-care/ADL training;DME and/or AE instruction;Manual lymph drainage;Compression bandaging;Therapeutic activities;Coping strategies training;Therapeutic exercise;Other (comment);Manual Therapy;Patient/family education   fit with appropriate compression garments and devices; coordinate trial with Advanced sequential pneumatic compression device ("pump" - Flexitouch   Plan Complete Decongestive Therapy (CDT): MLD, compression wraps and/ or garments and devices, skin care, ther ex    Consulted and Agree with Plan of Care Patient             Patient will benefit from skilled therapeutic intervention in order to improve the following deficits and impairments:   Body Structure / Function / Physical Skills: ADL, Decreased knowledge of use of DME, Pain, Edema, IADL, Decreased knowledge of precautions, Skin integrity       Visit Diagnosis: Lymphedema, not elsewhere classified    Problem List Patient Active Problem List   Diagnosis Date Noted   Lymphedema 10/26/2020   Anemia 05/04/2020   Swelling of limb 05/04/2020   SVT (supraventricular tachycardia) (HCC) 06/23/2019   Chronic heartburn 08/27/2018   Slow transit constipation 08/27/2018   Other dysphagia 05/24/2017   Raynaud disease 02/14/2017   Systemic lupus (HCC) 02/23/2016   SLE-Sjogren overlap syndrome (HCC) 02/23/2016   B12 deficiency 02/23/2016   Hypertension 02/23/2016   DDD (degenerative disc disease), lumbar 06/15/2014   Lumbar radiculitis 06/15/2014   Midline low back pain without sciatica 04/16/2014   Lupus pernio 04/16/2014    Loel Dubonnet, MS, OTR/L, [facility] 11/05/20 10:25 AM   [place] [facility] MAIN [facility] SERVICES [address] [place], [place], [address] Phone: [phone]   Fax:  [phone]  Name: Natalie Wheeler MRN: [id number] Date of Birth: 18-Nov-1965

## 2020-11-11 ENCOUNTER — Ambulatory Visit: Payer: No Typology Code available for payment source | Admitting: Occupational Therapy

## 2020-11-11 ENCOUNTER — Other Ambulatory Visit: Payer: Self-pay

## 2020-11-11 DIAGNOSIS — I890 Lymphedema, not elsewhere classified: Secondary | ICD-10-CM

## 2020-11-11 NOTE — Therapy (Signed)
[place] MAIN [facility] SERVICES [address] [place], [place], [address] Phone: [phone]   Fax:  [phone]  Occupational Therapy Treatment Note and Discharge Summary: Lymphedema Care  Patient Details  Name: Natalie Wheeler MRN: [id number] Date of Birth: [birth year] Referring Provider (OT): Ramonita Lab, MD   Encounter Date: 11/11/2020   OT End of Session - 11/11/20 1630     Visit Number 12    Number of Visits 36    Date for OT Re-Evaluation 11/23/20    Authorization Type [place]    OT Start Time 0205    OT Stop Time 0330    OT Time Calculation (min) 85 min    Activity Tolerance Patient tolerated treatment well;No increased pain    Behavior During Therapy WFL for tasks assessed/performed             Past Medical History:  Diagnosis Date   Anemia    Cervicitis    Decreased libido    Degenerative disc disease, lumbar    L1-L5   Dyspareunia    Hypertension    Keratosis pilaris    Lupus ([place])    Lupus ([place])    Motion sickness    ships, back seat cars   Raynaud's disease    Raynaud's syndrome    Raynauds syndrome    Sciatica    right side   Sjogren's disease (HCC)    SVT (supraventricular tachycardia) ([place])    controlled with metoprolol   URI (upper respiratory infection)    Head cold, started last week. Antibiotic and prenisone taper rx'd on 02/12/17. Improved.   Vaginal Pap smear, abnormal     Past Surgical History:  Procedure Laterality Date   BAND HEMORRHOIDECTOMY     COLONOSCOPY WITH PROPOFOL N/A 08/03/2017   Procedure: COLONOSCOPY WITH PROPOFOL;  Surgeon: Manya Silvas, MD;  Location: [facility] ENDOSCOPY;  Service: Endoscopy;  Laterality: N/A;   LEEP     NASAL SEPTOPLASTY W/ TURBINOPLASTY Bilateral 02/21/2017   Procedure: NASAL SEPTOPLASTY WITH  INFERIOR TURBINATE REDUCTION;  Surgeon: Carloyn Manner, MD;  Location: [place];  Service: ENT;  Laterality: Bilateral;   [place] Bilateral    TONSILLECTOMY       There were no vitals filed for this visit.   Subjective Assessment - 11/11/20 1638     Subjective  [place] presents for OT visit 12/36 to address BLE swelling. Pt reports [employer] rep informed her that her insurance coverage for a Flexitouch device leaves 2000$ out of pocket cost to her. OT emailed rep and asked him to call Pt to clarify questions re out of pocket expense. Today is Natalie Wheeler's last OT visit for lymphedema care.    Pertinent History relevant to lymphedema (LE) systemic lupus, htn, Raynaud's Syndrome, hx  Keritosis piaris, DJD L1-L5, supraventricular tachycardia    Limitations Leg swelling and associated  pain limits ability to fit preferred street shoes, ability to perform exercise for health, limits abilty to perform work activities and some leisure pursuits, limits social participation, limits body image (clothing selection)    Repetition Increases Symptoms    Special Tests +Stemmer    Pain Onset More than a month ago                 LYMPHEDEMA/ONCOLOGY QUESTIONNAIRE - 11/11/20 1646       Lymphedema Assessments   Lymphedema Assessments Lower extremities      Right Lower Extremity Lymphedema   Other RLE (dominant0  A-D  volume = 2726.6 ML; e-g VOLUME = 5472.1 ML; a-g VOLUME = 8198.7 ML.    Other RLE leg volume (A-D) is decreased by 1.29%, thigh volume is reduced by 2.13%, and full R limb volume is decreased by 1.9% since initially commencing CDT on 412/22.      Left Lower Extremity Lymphedema   Other LLE A-D limb volume = 3330.7 ml; E-G= 5166.9 ml; A-G vlume= 8497.5 ml.    Other RLE leg volume (A-D) is decreased by 1.29%, thigh volume is reduced by 2.13%, and full R limb volume is decreased by 1.9% since initially commencing CDT on 412/22. LLE leg volume (A-D) is increased by 16.88 %, thigh volume is deceased by 11.3%, and full L limb volume is decreased by 2.04 % since initially commencing CDT on 412/22.                     OT  Treatments/Exercises (OP) - 11/11/20 1644       ADLs   ADL Education Given Yes      Manual Therapy   Manual Therapy Edema management    Manual therapy comments Intake FOTO 87/100. Final FOTO 80/100. Foto score is decresed by 7 points since commencing OT for CDT    Edema Management BLE comparative limb volumetrics    Manual Lymphatic Drainage (MLD) MLD to LLE/LLQ as established to address non-cancer related lymphedema al breathing to activate throacic duct, functional inguinal LN, then providing proximal to distal J strokes to each lower extremity segment with emphasis on posterior knee LN and "bottleneck" at medial knee. Good tolerance. No increased pain.                    OT Education - 11/11/20 1700     Education Details Continued Pt/ CG edu for lymphedema self care  and home program throughout session. Topics include multilayer, gradient compression wrapping, simple self-MLD, therapeutic lymphatic pumping exercises, skin/nail care, risk reduction factors and LE precautions, compression garments/recommendations and wear and care schedule and compression garment donning / doffing using assistive devices. All questions answered to the Pt's satisfaction, and Pt demonstrates understanding by report.    Person(s) Educated Patient    Methods Explanation;Demonstration;Handout    Comprehension Verbalized understanding;Returned demonstration;Need further instruction                 OT Long Term Goals - 11/11/20 1642       OT LONG TERM GOAL #1   Title Pt will be able to apply knee length, multi-layer, short stretch compression wraps using correct gradient techniques with extra time (modified independence) to return affected limb to premorbid size and shape, to limit leg pain and infection risk, and to improve safe functional ambulation and mobility.    Baseline Max A    Time 4    Period Days    Status Deferred   DC goal. Pt utilizing ccl 1 (20-30 mmHg) knee high compression  garmen6ts daily with good results for controlling swelling     OT LONG TERM GOAL #2   Title Patient will demonstrate understanding of LE precautions, prevention strategies and cellulitis signs and symptoms by verbalizing at least 4 examples using printed reference with modified assistance to limit infection risk, recurrent wounds and LE progression.    Baseline Max A    Time 4    Period Days    Status Achieved      OT LONG TERM GOAL #3   Title Pt will achieve  at least a 5%  limb volume reduction  in both legs during Intensive Phase CDT to prevent re-accumulation of lymphatic congestion and progression of fibrosis, to limit infection risk, to improve functional ambulation and transfers, and  to improve functional performance of basic and instrumental ADLs, and to limit LE progression.    Baseline Max A    Time 12    Period Weeks    Status Partially Met   see PLAN section for Clinical Impression statement for detailed volumetrics.Goal met for some landmarks and not others.     OT LONG TERM GOAL #4   Title Pt will achieve and sustain a least 85% compliance performing all daily LE self-care home program components throughout Intensive Phase CDT, including recommended skin care regime, lymphatic pumping ther ex, 23/7 compression wraps and simple self-MLD, to ensure optimal limb volume reduction, to limit infection risk and to limit LE progression.    Baseline Max A    Time 12    Period Weeks    Status Achieved      OT LONG TERM GOAL #5   Title By DC from OT Pt will be able to don and doff appropriate daytime compression garments and HOS devices with Max CG assistance to limit lymphatic re-accumulation and LE progression with before transitioning to self-management phase of CDT.    Baseline Max A    Time 12    Period Weeks    Status Achieved      OT LONG TERM GOAL #6   Title Pt will report decrease in lymphedema related leg pain/discomfort from 6/10 to 3/10 (50%) by OT DC to improve  functional performance in all occupational domains and to improve quality of life.    Baseline Mod A    Time 12    Period Weeks    Status Partially Met                   Plan - 11/11/20 1631     Clinical Impression Statement Provided LLE MLD as established without increased pain. Completed anatomical measurements for replacement off-the-shelf coompression knee highs. Completed BLE comparative limb volumetrics, which reveal surprising resuts. RLE leg volume (A-D) is decreased by 1.29%, thigh volume is reduced by 2.13%, and full R limb volume is decreased by 1.9% since initially commencing CDT on 412/22.  RLE is minimally reduced in volume today, which may represent normal volume  fluctuation. The LLE , however. is dramatically increased by 16.88 %below the knee, and sinificantly decreased in the thigh by 11.3%. The full LLE volume is decreased by 2.04 % since initially commencing CDT on 412/22. Pt will purchase compression stockings and wear on both legs. She'll monitor her conditiona and returns to OT PRN. Pt's "after" score on the Functional Outcome Scale is actually 7 points decreased today (80/100) compared with intake score measuring 87/100. Pt reports that she feels that OT for MLD has been beneficial, but FOTO score does not indicate increased function. Pt also presents with systemic Lupus, which we did not address, thus the FOTO tool may not have captured decreased function as a result of this co-morbidity. It has been a pleasure working with Natalie. E[place]. If I can assist her in the future I am happy to do so. Natalie. Brendaly Townsel is Discharged from OT for lymphedema care.    OT Occupational Profile and History Comprehensive Assessment- Review of records and extensive additional review of physical, cognitive, psychosocial history related to current functional performance  Occupational performance deficits (Please refer to evaluation for details): ADL's;IADL's;Work;Leisure;Social  Participation;Other   body image   Body Structure / Function / Physical Skills ADL;Decreased knowledge of use of DME;Pain;Edema;IADL;Decreased knowledge of precautions;Skin integrity    Rehab Potential Good    Clinical Decision Making Several treatment options, min-mod task modification necessary    Comorbidities Affecting Occupational Performance: Presence of comorbidities impacting occupational performance    Comorbidities impacting occupational performance description: SEE SUBJECTIVE    Modification or Assistance to Complete Evaluation  Min-Moderate modification of tasks or assist with assess necessary to complete eval    OT Frequency 1x / week   36 weeks   OT Duration Other (comment)    OT Treatment/Interventions Self-care/ADL training;DME and/or AE instruction;Manual lymph drainage;Compression bandaging;Therapeutic activities;Coping strategies training;Therapeutic exercise;Other (comment);Manual Therapy;Patient/family education   fit with appropriate compression garments and devices; coordinate trial with Advanced sequential pneumatic compression device ("pump" - Flexitouch   Plan Complete Decongestive Therapy (CDT): MLD, compression wraps and/ or garments and devices, skin care, ther ex    Consulted and Agree with Plan of Care Patient             Patient will benefit from skilled therapeutic intervention in order to improve the following deficits and impairments:   Body Structure / Function / Physical Skills: ADL, Decreased knowledge of use of DME, Pain, Edema, IADL, Decreased knowledge of precautions, Skin integrity       Visit Diagnosis: Lymphedema, not elsewhere classified    Problem List Patient Active Problem List   Diagnosis Date Noted   Lymphedema 10/26/2020   Anemia 05/04/2020   Swelling of limb 05/04/2020   SVT (supraventricular tachycardia) (HCC) 06/23/2019   Chronic heartburn 08/27/2018   Slow transit constipation 08/27/2018   Other dysphagia 05/24/2017    Raynaud disease 02/14/2017   Systemic lupus ([place]) 02/23/2016   SLE-Sjogren overlap syndrome ([place]) 02/23/2016   B12 deficiency 02/23/2016   Hypertension 02/23/2016   DDD (degenerative disc disease), lumbar 06/15/2014   Lumbar radiculitis 06/15/2014   Midline low back pain without sciatica 04/16/2014   Lupus pernio 04/16/2014   Andrey Spearman, Natalie, OTR/L, [facility] 11/11/20 5:01 PM   [place] MAIN [facility] SERVICES [address] [place], [place], [address] Phone: [phone]   Fax:  [phone]  Name: Natalie Wheeler MRN: [id number] Date of Birth: [birth year]

## 2020-11-11 NOTE — Patient Instructions (Signed)

## 2020-11-22 ENCOUNTER — Encounter (INDEPENDENT_AMBULATORY_CARE_PROVIDER_SITE_OTHER): Payer: Self-pay

## 2020-11-22 NOTE — Telephone Encounter (Signed)
Dr. Wyn Quaker will need to note this as he last saw the patient

## 2020-11-25 ENCOUNTER — Other Ambulatory Visit: Payer: Self-pay

## 2020-11-26 ENCOUNTER — Other Ambulatory Visit: Payer: Self-pay

## 2020-12-14 ENCOUNTER — Other Ambulatory Visit: Payer: Self-pay

## 2020-12-28 ENCOUNTER — Other Ambulatory Visit: Payer: Self-pay

## 2020-12-28 MED FILL — Progesterone Cap 200 MG: ORAL | 90 days supply | Qty: 90 | Fill #1 | Status: AC

## 2021-01-12 ENCOUNTER — Other Ambulatory Visit: Payer: Self-pay

## 2021-01-14 ENCOUNTER — Other Ambulatory Visit: Payer: Self-pay

## 2021-01-18 ENCOUNTER — Other Ambulatory Visit: Payer: Self-pay

## 2021-01-18 MED ORDER — HYDROXYCHLOROQUINE SULFATE 200 MG PO TABS
ORAL_TABLET | ORAL | 3 refills | Status: DC
  Filled 2021-01-18: qty 90, 90d supply, fill #0
  Filled 2021-04-11: qty 90, 90d supply, fill #1
  Filled 2021-07-15: qty 90, 90d supply, fill #2
  Filled 2021-10-24: qty 90, 90d supply, fill #3

## 2021-01-19 ENCOUNTER — Other Ambulatory Visit: Payer: Self-pay

## 2021-01-19 MED ORDER — DICLOFENAC POTASSIUM 50 MG PO TABS
ORAL_TABLET | ORAL | 3 refills | Status: DC
  Filled 2021-01-19: qty 180, 90d supply, fill #0
  Filled 2021-04-11: qty 180, 90d supply, fill #1
  Filled 2021-07-15: qty 180, 90d supply, fill #2
  Filled 2021-10-24: qty 180, 90d supply, fill #3

## 2021-01-20 ENCOUNTER — Other Ambulatory Visit: Payer: Self-pay

## 2021-01-25 ENCOUNTER — Other Ambulatory Visit: Payer: Self-pay

## 2021-03-24 ENCOUNTER — Other Ambulatory Visit: Payer: Self-pay

## 2021-03-24 MED FILL — Progesterone Cap 200 MG: ORAL | 90 days supply | Qty: 90 | Fill #2 | Status: AC

## 2021-04-11 ENCOUNTER — Other Ambulatory Visit: Payer: Self-pay

## 2021-04-12 ENCOUNTER — Other Ambulatory Visit: Payer: Self-pay

## 2021-04-14 ENCOUNTER — Other Ambulatory Visit: Payer: Self-pay

## 2021-04-14 MED ORDER — METOPROLOL TARTRATE 25 MG PO TABS
ORAL_TABLET | ORAL | 1 refills | Status: DC
  Filled 2021-04-14: qty 90, 90d supply, fill #0
  Filled 2021-07-15: qty 90, 90d supply, fill #1

## 2021-04-26 ENCOUNTER — Other Ambulatory Visit: Payer: Self-pay

## 2021-04-26 ENCOUNTER — Encounter (INDEPENDENT_AMBULATORY_CARE_PROVIDER_SITE_OTHER): Payer: Self-pay | Admitting: Vascular Surgery

## 2021-04-26 ENCOUNTER — Ambulatory Visit (INDEPENDENT_AMBULATORY_CARE_PROVIDER_SITE_OTHER): Payer: No Typology Code available for payment source | Admitting: Vascular Surgery

## 2021-04-26 VITALS — BP 165/95 | HR 80 | Resp 16 | Ht 65.0 in | Wt 151.0 lb

## 2021-04-26 DIAGNOSIS — I7300 Raynaud's syndrome without gangrene: Secondary | ICD-10-CM

## 2021-04-26 DIAGNOSIS — I10 Essential (primary) hypertension: Secondary | ICD-10-CM | POA: Diagnosis not present

## 2021-04-26 DIAGNOSIS — I890 Lymphedema, not elsewhere classified: Secondary | ICD-10-CM

## 2021-04-26 NOTE — Progress Notes (Signed)
MRN : [id number]  Natalie Wheeler is a 55 y.o. (11-Mar-1966) female who presents with chief complaint of  Chief Complaint  Patient presents with   Follow-up    6 mos  no studies  .  History of Present Illness: Patient returns today in follow up of her lymphedema.  She was unable to get the lymphedema pump that was ordered due to insurance issues.  At this point, she continues to diligently wear her compression socks, elevate her legs, and she remains active and in good shape.  Despite this, her leg swelling persists.  It is not dramatic, but it is bothersome to her.  Her legs are heavy and her pants are much more tightly fitting.  No wounds or ulceration.  No fevers or chills  Current Outpatient Medications  Medication Sig Dispense Refill   azelastine (ASTELIN) 0.1 % nasal spray Place 1 spray into both nostrils two times daily as needed for runny nose. 30 mL 11   baclofen (LIORESAL) 20 MG tablet Take 20 mg by mouth as needed for muscle spasms.     calcium-vitamin D (OSCAL WITH D) 250-125 MG-UNIT tablet Take 1 tablet by mouth daily.     cyanocobalamin 1000 MCG tablet Take by mouth.     cycloSPORINE (RESTASIS) 0.05 % ophthalmic emulsion Place 1 drop into both eyes 2 (two) times daily.     diclofenac (CATAFLAM) 50 MG tablet Take 1 tablet (50 mg total) by mouth 2 (two) times daily as needed 180 tablet 3   estradiol (ESTRACE) 0.1 MG/GM vaginal cream PLACE 1 APPLICATORFUL (2GM) VAGINALLY AT BEDTIME 42.5 g 12   hydroxychloroquine (PLAQUENIL) 200 MG tablet Take 1 tablet (200 mg total) by mouth once daily 90 tablet 3   linaclotide (LINZESS) 290 MCG CAPS capsule Take by mouth.     loratadine (CLARITIN) 10 MG tablet Take 10 mg by mouth daily.     magnesium 30 MG tablet Take 30 mg by mouth 2 (two) times daily.     metoprolol tartrate (LOPRESSOR) 25 MG tablet TAKE 1/2 TABLET (12.5 MG TOTAL) BY MOUTH 2 TIMES DAILY 90 tablet 1   Multiple Vitamin (MULTIVITAMIN) tablet Take 1 tablet by mouth daily.      Omega-3 Fatty Acids (FISH OIL) 1000 MG CAPS Take by mouth.     progesterone (PROMETRIUM) 200 MG capsule Take 1 capsule (200 mg total) by mouth daily. 90 capsule 0   cephALEXin (KEFLEX) 500 MG capsule Take 1 capsule (500 mg total) by mouth 3 (three) times daily for 7 days (Patient not taking: Reported on 04/26/2021) 21 capsule 0   cetirizine (ZYRTEC) 10 MG tablet Take 10 mg by mouth daily. (Patient not taking: Reported on 05/03/2020)     doxycycline (VIBRAMYCIN) 100 MG capsule Take 1 capsule (100 mg total) by mouth 2 (two) times daily for 7 days (Patient not taking: Reported on 10/26/2020) 14 capsule 0   linaclotide (LINZESS) 290 MCG CAPS capsule TAKE 1 CAPSULE (290 MCG TOTAL) BY MOUTH ONCE DAILY 90 capsule 3   linaclotide (LINZESS) 290 MCG CAPS capsule Take 1 capsule (290 mcg total) by mouth once daily 90 capsule 3   metoprolol tartrate (LOPRESSOR) 25 MG tablet Take 25 mg by mouth daily.     pantoprazole (PROTONIX) 40 MG tablet Take by mouth. (Patient not taking: Reported on 04/26/2021)     pantoprazole (PROTONIX) 40 MG tablet TAKE 1 TABLET (40 MG TOTAL) BY MOUTH ONCE DAILY 90 tablet 1   scopolamine (TRANSDERM-SCOP) 1 MG/3DAYS  PLACE 1 PATCH BEHIND THE EAR EVERY THIRD DAY AS NEEDED AT LEAST 4 HOURS BEFORE EXPOSURE 4 patch 1   No current facility-administered medications for this visit.    Past Medical History:  Diagnosis Date   Anemia    Cervicitis    Decreased libido    Degenerative disc disease, lumbar    L1-L5   Dyspareunia    Hypertension    Keratosis pilaris    Lupus ([place])    Lupus ([place])    Motion sickness    ships, back seat cars   Raynaud's disease    Raynaud's syndrome    Raynauds syndrome    Sciatica    right side   Sjogren's disease (HCC)    SVT (supraventricular tachycardia) ([place])    controlled with metoprolol   URI (upper respiratory infection)    Head cold, started last week. Antibiotic and prenisone taper rx'd on 02/12/17. Improved.   Vaginal Pap smear, abnormal      Past Surgical History:  Procedure Laterality Date   BAND HEMORRHOIDECTOMY     COLONOSCOPY WITH PROPOFOL N/A 08/03/2017   Procedure: COLONOSCOPY WITH PROPOFOL;  Surgeon: Manya Silvas, MD;  Location: [facility] ENDOSCOPY;  Service: Endoscopy;  Laterality: N/A;   LEEP     NASAL SEPTOPLASTY W/ TURBINOPLASTY Bilateral 02/21/2017   Procedure: NASAL SEPTOPLASTY WITH  INFERIOR TURBINATE REDUCTION;  Surgeon: Carloyn Manner, MD;  Location: [facility];  Service: ENT;  Laterality: Bilateral;   PRK Bilateral    TONSILLECTOMY       Social History   Tobacco Use   Smoking status: Former    Types: Cigarettes    Quit date: 1993    Years since quitting: 29.9   Smokeless tobacco: Never  Vaping Use   Vaping Use: Never used  Substance Use Topics   Alcohol use: Yes    Alcohol/week: 3.0 standard drinks    Types: 3 Glasses of wine per week    Comment: none last 24hrs   Drug use: No      Family History  Problem Relation Age of Onset   Breast cancer Paternal Aunt 74   Diabetes Maternal Grandmother    Heart disease Maternal Grandfather    Heart disease Paternal Grandmother    Cancer Maternal Aunt        ovarian   Cirrhosis Father      Allergies  Allergen Reactions   Floxin [Ofloxacin] Shortness Of Breath   Buspirone Nausea Only   Ciprofloxacin    Doxycycline    Mobic [Meloxicam]    Nifedipine     Hot flashes   Sulfadiazine    Tramadol Nausea Only   Zofran [Ondansetron Hcl]    Sulfa Antibiotics Rash     REVIEW OF SYSTEMS (Negative unless checked)   Constitutional: [] Weight loss  [] Fever  [] Chills Cardiac: [] Chest pain   [] Chest pressure   [] Palpitations   [] Shortness of breath when laying flat   [] Shortness of breath at rest   [] Shortness of breath with exertion. Vascular:  [x] Pain in legs with walking   [] Pain in legs at rest   [] Pain in legs when laying flat   [] Claudication   [] Pain in feet when walking  [] Pain in feet at rest  [] Pain in feet when laying flat    [] History of DVT   [] Phlebitis   [x] Swelling in legs   [] Varicose veins   [] Non-healing ulcers Pulmonary:   [] Uses home oxygen   [] Productive cough   [] Hemoptysis   [] Wheeze  []   COPD   [] Asthma Neurologic:  [] Dizziness  [] Blackouts   [] Seizures   [] History of stroke   [] History of TIA  [] Aphasia   [] Temporary blindness   [] Dysphagia   [] Weakness or numbness in arms   [] Weakness or numbness in legs Musculoskeletal:  [x] Arthritis   [] Joint swelling   [] Joint pain   [x] Low back pain Hematologic:  [] Easy bruising  [] Easy bleeding   [] Hypercoagulable state   [x] Anemic  [] Hepatitis Gastrointestinal:  [] Blood in stool   [] Vomiting blood  [] Gastroesophageal reflux/heartburn   [] Abdominal pain Genitourinary:  [] Chronic kidney disease   [] Difficult urination  [] Frequent urination  [] Burning with urination   [] Hematuria Skin:  [] Rashes   [] Ulcers   [] Wounds Psychological:  [] History of anxiety   []  History of major depression.  Physical Examination  BP (!) 165/95 (BP Location: Left Arm)    Pulse 80    Resp 16    Ht 5\' 5"  (1.651 m)    Wt 151 lb (68.5 kg)    BMI 25.13 kg/m  Gen:  WD/WN, NAD. Appears younger than stated age. Head: Cobb/AT, No temporalis wasting. Ear/Nose/Throat: Hearing grossly intact, nares w/o erythema or drainage Eyes: Conjunctiva clear. Sclera non-icteric Neck: Supple.  Trachea midline Pulmonary:  Good air movement, no use of accessory muscles.  Cardiac: RRR, no JVD Vascular:  Vessel Right Left  Radial Palpable Palpable                          PT Palpable Palpable  DP Palpable Palpable   Gastrointestinal: soft, non-tender/non-distended. No guarding/reflex.  Musculoskeletal: M/S 5/5 throughout.  No deformity or atrophy. 1+ BLE edema. Neurologic: Sensation grossly intact in extremities.  Symmetrical.  Speech is fluent.  Psychiatric: Judgment intact, Mood & affect appropriate for pt's clinical situation. Dermatologic: No rashes or ulcers noted.  No cellulitis or open  wounds.      Labs No results found for this or any previous visit (from the past 2160 hour(s)).  Radiology No results found.  Assessment/Plan Hypertension blood pressure control important in reducing the progression of atherosclerotic disease. On appropriate oral medications.     Raynaud disease Bothersome but not debilitating  Lymphedema Still has some swelling in her legs but this is reasonably stable.  Was never able to get her lymphedema pump due to insurance approval.  Continues to do compression and elevation daily.  At this point, I think an annual visit or a visit only if her symptoms worsen is reasonable.    , MD  04/26/2021 5:00 PM    This note was created with Dragon medical transcription system.  Any errors from dictation are purely unintentional

## 2021-04-26 NOTE — Assessment & Plan Note (Signed)
Still has some swelling in her legs but this is reasonably stable.  Was never able to get her lymphedema pump due to insurance approval.  Continues to do compression and elevation daily.  At this point, I think an annual visit or a visit only if her symptoms worsen is reasonable.

## 2021-05-10 ENCOUNTER — Ambulatory Visit (INDEPENDENT_AMBULATORY_CARE_PROVIDER_SITE_OTHER): Payer: No Typology Code available for payment source | Admitting: Certified Nurse Midwife

## 2021-05-10 ENCOUNTER — Other Ambulatory Visit: Payer: Self-pay

## 2021-05-10 ENCOUNTER — Encounter: Payer: Self-pay | Admitting: Certified Nurse Midwife

## 2021-05-10 VITALS — BP 142/79 | HR 74 | Ht 65.0 in | Wt 153.0 lb

## 2021-05-10 DIAGNOSIS — Z1231 Encounter for screening mammogram for malignant neoplasm of breast: Secondary | ICD-10-CM | POA: Diagnosis not present

## 2021-05-10 DIAGNOSIS — Z01419 Encounter for gynecological examination (general) (routine) without abnormal findings: Secondary | ICD-10-CM

## 2021-05-10 NOTE — Patient Instructions (Signed)

## 2021-05-10 NOTE — Progress Notes (Signed)
GYNECOLOGY ANNUAL PREVENTATIVE CARE ENCOUNTER NOTE  History:     Natalie Wheeler is a 55 y.o. G0P0 female here for a routine annual gynecologic exam.  Current complaints: none.   Denies abnormal vaginal bleeding, discharge, pelvic pain, problems with intercourse or other gynecologic concerns.     Social Relationship: yes, female partner Living: Work: medical coding  Exercise: yoga /zumba Smoke/Alcohol/drug use: Occasional alcohol use   Gynecologic History No LMP recorded (lmp unknown). (Menstrual status: Perimenopausal). Contraception: post menopausal status Last Pap: 05/04/20. Results were: normal with negative HPV Last mammogram: 05/18/2019. Results were: normal  Obstetric History OB History  Gravida Para Term Preterm AB Living  0            SAB IAB Ectopic Multiple Live Births               Past Medical History:  Diagnosis Date   Anemia    Cervicitis    Decreased libido    Degenerative disc disease, lumbar    L1-L5   Dyspareunia    Hypertension    Keratosis pilaris    Lupus (HCC)    Lupus (HCC)    Motion sickness    ships, back seat cars   Raynaud's disease    Raynaud's syndrome    Raynauds syndrome    Sciatica    right side   Sjogren's disease (HCC)    SVT (supraventricular tachycardia) (HCC)    controlled with metoprolol   URI (upper respiratory infection)    Head cold, started last week. Antibiotic and prenisone taper rx'd on 02/12/17. Improved.   Vaginal Pap smear, abnormal   Lymphedema   Past Surgical History:  Procedure Laterality Date   BAND HEMORRHOIDECTOMY     COLONOSCOPY WITH PROPOFOL N/A 08/03/2017   Procedure: COLONOSCOPY WITH PROPOFOL;  Surgeon: Scot Jun, MD;  Location: [facility] ENDOSCOPY;  Service: Endoscopy;  Laterality: N/A;   LEEP     NASAL SEPTOPLASTY W/ TURBINOPLASTY Bilateral 02/21/2017   Procedure: NASAL SEPTOPLASTY WITH  INFERIOR TURBINATE REDUCTION;  Surgeon: Bud Face, MD;  Location: [facility];  Service:  ENT;  Laterality: Bilateral;   PRK Bilateral    TONSILLECTOMY      Current Outpatient Medications on File Prior to Visit  Medication Sig Dispense Refill   azelastine (ASTELIN) 0.1 % nasal spray Place 1 spray into both nostrils two times daily as needed for runny nose. 30 mL 11   baclofen (LIORESAL) 20 MG tablet Take 20 mg by mouth as needed for muscle spasms.     calcium-vitamin D (OSCAL WITH D) 250-125 MG-UNIT tablet Take 1 tablet by mouth daily.     cyanocobalamin 1000 MCG tablet Take by mouth.     cycloSPORINE (RESTASIS) 0.05 % ophthalmic emulsion Place 1 drop into both eyes 2 (two) times daily.     diclofenac (CATAFLAM) 50 MG tablet Take 1 tablet (50 mg total) by mouth 2 (two) times daily as needed 180 tablet 3   estradiol (ESTRACE) 0.1 MG/GM vaginal cream PLACE 1 APPLICATORFUL (2GM) VAGINALLY AT BEDTIME 42.5 g 12   hydroxychloroquine (PLAQUENIL) 200 MG tablet Take 1 tablet (200 mg total) by mouth once daily 90 tablet 3   linaclotide (LINZESS) 290 MCG CAPS capsule Take by mouth.     linaclotide (LINZESS) 290 MCG CAPS capsule Take 1 capsule (290 mcg total) by mouth once daily 90 capsule 3   loratadine (CLARITIN) 10 MG tablet Take 10 mg by mouth daily.     magnesium  30 MG tablet Take 30 mg by mouth 2 (two) times daily.     metoprolol tartrate (LOPRESSOR) 25 MG tablet Take 25 mg by mouth daily.     metoprolol tartrate (LOPRESSOR) 25 MG tablet TAKE 1/2 TABLET (12.5 MG TOTAL) BY MOUTH 2 TIMES DAILY 90 tablet 1   Multiple Vitamin (MULTIVITAMIN) tablet Take 1 tablet by mouth daily.     Omega-3 Fatty Acids (FISH OIL) 1000 MG CAPS Take by mouth.     pantoprazole (PROTONIX) 40 MG tablet Take by mouth.     progesterone (PROMETRIUM) 200 MG capsule Take 1 capsule (200 mg total) by mouth daily. 90 capsule 0   cephALEXin (KEFLEX) 500 MG capsule Take 1 capsule (500 mg total) by mouth 3 (three) times daily for 7 days (Patient not taking: Reported on 04/26/2021) 21 capsule 0   cetirizine (ZYRTEC) 10 MG  tablet Take 10 mg by mouth daily. (Patient not taking: Reported on 05/03/2020)     doxycycline (VIBRAMYCIN) 100 MG capsule Take 1 capsule (100 mg total) by mouth 2 (two) times daily for 7 days (Patient not taking: Reported on 10/26/2020) 14 capsule 0   linaclotide (LINZESS) 290 MCG CAPS capsule TAKE 1 CAPSULE (290 MCG TOTAL) BY MOUTH ONCE DAILY 90 capsule 3   pantoprazole (PROTONIX) 40 MG tablet TAKE 1 TABLET (40 MG TOTAL) BY MOUTH ONCE DAILY 90 tablet 1   scopolamine (TRANSDERM-SCOP) 1 MG/3DAYS PLACE 1 PATCH BEHIND THE EAR EVERY THIRD DAY AS NEEDED AT LEAST 4 HOURS BEFORE EXPOSURE 4 patch 1   No current facility-administered medications on file prior to visit.    Allergies  Allergen Reactions   Floxin [Ofloxacin] Shortness Of Breath   Buspirone Nausea Only   Ciprofloxacin    Doxycycline    Mobic [Meloxicam]    Nifedipine     Hot flashes   Sulfadiazine    Tramadol Nausea Only   Zofran [Ondansetron Hcl]    Sulfa Antibiotics Rash    Social History:  reports that she quit smoking about 30 years ago. Her smoking use included cigarettes. She has never used smokeless tobacco. She reports current alcohol use of about 3.0 standard drinks per week. She reports that she does not use drugs.  Family History  Problem Relation Age of Onset   Breast cancer Paternal Aunt 83   Diabetes Maternal Grandmother    Heart disease Maternal Grandfather    Heart disease Paternal Grandmother    Cancer Maternal Aunt        ovarian   Cirrhosis Father     The following portions of the patient's history were reviewed and updated as appropriate: allergies, current medications, past family history, past medical history, past social history, past surgical history and problem list.  Review of Systems Pertinent items noted in HPI and remainder of comprehensive ROS otherwise negative.  Physical Exam:  BP (!) 142/79    Pulse 74    Ht 5\' 5"  (1.651 m)    Wt 153 lb (69.4 kg)    LMP  (LMP Unknown)    BMI 25.46 kg/m   CONSTITUTIONAL: Well-developed, well-nourished female in no acute distress.  HENT:  Normocephalic, atraumatic, External right and left ear normal. Oropharynx is clear and moist EYES: Conjunctivae and EOM are normal. Pupils are equal, round, and reactive to light. No scleral icterus.  NECK: Normal range of motion, supple, no masses.  Normal thyroid.  SKIN: Skin is warm and dry. No rash noted. Not diaphoretic. No erythema. No pallor. MUSCULOSKELETAL: Normal range  of motion. No tenderness.  No cyanosis, clubbing, or edema.  2+ distal pulses. NEUROLOGIC: Alert and oriented to person, place, and time. Normal reflexes, muscle tone coordination.  PSYCHIATRIC: Normal mood and affect. Normal behavior. Normal judgment and thought content. CARDIOVASCULAR: Normal heart rate noted, regular rhythm RESPIRATORY: Clear to auscultation bilaterally. Effort and breath sounds normal, no problems with respiration noted. BREASTS: Symmetric in size. No masses, tenderness, skin changes, nipple drainage, or lymphadenopathy bilaterally.  ABDOMEN: Soft, no distention noted.  No tenderness, rebound or guarding.  PELVIC: Normal appearing external genitalia and urethral meatus; normal appearing vaginal mucosa and cervix.  No abnormal discharge noted.  Pap smear not due.  Normal uterine size, no other palpable masses, no uterine or adnexal tenderness.  .   Assessment and Plan:    1. Well woman exam with routine gynecological exam   Pap:not due Mammogram : ordered Labs:  none  Refills: none Referral: none Pt c/o vaginal dryness encouraged use of vaginal moisturizer.  Routine preventative health maintenance measures emphasized. Please refer to After Visit Summary for other counseling recommendations.      Doreene Burke, CNM [facility],  [facility]

## 2021-05-26 ENCOUNTER — Other Ambulatory Visit: Payer: Self-pay

## 2021-05-26 MED ORDER — BACLOFEN 20 MG PO TABS
ORAL_TABLET | ORAL | 1 refills | Status: DC
  Filled 2021-05-26: qty 360, 90d supply, fill #0
  Filled 2021-10-06: qty 223, 74d supply, fill #1

## 2021-05-27 ENCOUNTER — Other Ambulatory Visit: Payer: Self-pay

## 2021-05-30 ENCOUNTER — Other Ambulatory Visit: Payer: Self-pay

## 2021-06-27 ENCOUNTER — Other Ambulatory Visit: Payer: Self-pay

## 2021-06-27 ENCOUNTER — Other Ambulatory Visit: Payer: Self-pay | Admitting: Certified Nurse Midwife

## 2021-06-27 ENCOUNTER — Telehealth: Payer: Self-pay | Admitting: Certified Nurse Midwife

## 2021-06-27 MED ORDER — PROGESTERONE 200 MG PO CAPS
200.0000 mg | ORAL_CAPSULE | Freq: Every day | ORAL | 3 refills | Status: DC
  Filled 2021-06-27: qty 90, 90d supply, fill #0
  Filled 2021-10-06: qty 90, 90d supply, fill #1
  Filled 2022-01-12: qty 65, 65d supply, fill #2
  Filled 2022-01-13: qty 25, 25d supply, fill #2
  Filled 2022-04-11: qty 30, 30d supply, fill #3
  Filled 2022-05-10: qty 60, 60d supply, fill #4

## 2021-06-27 MED ORDER — QUETIAPINE FUMARATE 25 MG PO TABS
25.0000 mg | ORAL_TABLET | Freq: Every day | ORAL | 11 refills | Status: DC
  Filled 2021-06-27: qty 30, 30d supply, fill #0

## 2021-06-27 NOTE — Telephone Encounter (Signed)
Pt called stating that her Rx for progestogen has been denied twice by [facility] according to pharmacy- pt states that she will run out in 1 week and is inquiring about RX refill.

## 2021-07-12 ENCOUNTER — Other Ambulatory Visit: Payer: Self-pay

## 2021-07-12 MED ORDER — CYCLOSPORINE 0.05 % OP EMUL
OPHTHALMIC | 3 refills | Status: DC
  Filled 2021-07-12: qty 180, 90d supply, fill #0
  Filled 2021-11-21: qty 180, 90d supply, fill #1
  Filled 2022-02-16: qty 180, 90d supply, fill #2
  Filled 2022-06-30: qty 180, 90d supply, fill #3

## 2021-07-13 ENCOUNTER — Other Ambulatory Visit: Payer: Self-pay

## 2021-07-14 ENCOUNTER — Ambulatory Visit
Admission: RE | Admit: 2021-07-14 | Discharge: 2021-07-14 | Disposition: A | Discharge status: Home / Self Care | Payer: No Typology Code available for payment source | Source: Ambulatory Visit | Attending: Certified Nurse Midwife | Admitting: Certified Nurse Midwife

## 2021-07-14 ENCOUNTER — Other Ambulatory Visit: Payer: Self-pay

## 2021-07-14 DIAGNOSIS — Z1231 Encounter for screening mammogram for malignant neoplasm of breast: Secondary | ICD-10-CM | POA: Diagnosis not present

## 2021-07-14 DIAGNOSIS — Z01419 Encounter for gynecological examination (general) (routine) without abnormal findings: Secondary | ICD-10-CM | POA: Diagnosis present

## 2021-07-15 ENCOUNTER — Other Ambulatory Visit: Payer: Self-pay | Admitting: Certified Nurse Midwife

## 2021-07-15 ENCOUNTER — Other Ambulatory Visit: Payer: Self-pay

## 2021-07-15 ENCOUNTER — Other Ambulatory Visit: Payer: Self-pay | Admitting: Internal Medicine

## 2021-07-15 DIAGNOSIS — R928 Other abnormal and inconclusive findings on diagnostic imaging of breast: Secondary | ICD-10-CM

## 2021-07-15 DIAGNOSIS — N6489 Other specified disorders of breast: Secondary | ICD-10-CM

## 2021-08-02 ENCOUNTER — Other Ambulatory Visit: Payer: Self-pay

## 2021-08-02 ENCOUNTER — Ambulatory Visit
Admission: RE | Admit: 2021-08-02 | Discharge: 2021-08-02 | Disposition: A | Discharge status: Home / Self Care | Payer: No Typology Code available for payment source | Source: Ambulatory Visit | Attending: Certified Nurse Midwife | Admitting: Certified Nurse Midwife

## 2021-08-02 DIAGNOSIS — R928 Other abnormal and inconclusive findings on diagnostic imaging of breast: Secondary | ICD-10-CM | POA: Diagnosis present

## 2021-08-02 DIAGNOSIS — N6489 Other specified disorders of breast: Secondary | ICD-10-CM | POA: Insufficient documentation

## 2021-08-05 ENCOUNTER — Other Ambulatory Visit: Payer: Self-pay

## 2021-08-08 ENCOUNTER — Other Ambulatory Visit: Payer: Self-pay

## 2021-08-08 MED ORDER — QUETIAPINE FUMARATE 25 MG PO TABS
25.0000 mg | ORAL_TABLET | Freq: Every day | ORAL | 3 refills | Status: DC
  Filled 2021-08-08: qty 90, 90d supply, fill #0
  Filled 2021-11-21: qty 90, 90d supply, fill #1
  Filled 2022-02-16: qty 90, 90d supply, fill #2
  Filled 2022-05-23: qty 90, 90d supply, fill #3

## 2021-09-14 ENCOUNTER — Other Ambulatory Visit: Payer: Self-pay

## 2021-09-14 MED ORDER — LINZESS 290 MCG PO CAPS
ORAL_CAPSULE | ORAL | 3 refills | Status: DC
  Filled 2021-09-14: qty 90, 90d supply, fill #0

## 2021-10-06 ENCOUNTER — Other Ambulatory Visit: Payer: Self-pay

## 2021-10-06 MED ORDER — AZELASTINE HCL 137 MCG/SPRAY NA SOLN
NASAL | 11 refills | Status: DC
  Filled 2021-10-06: qty 30, 50d supply, fill #0
  Filled 2022-02-16: qty 30, 50d supply, fill #1
  Filled 2022-05-10: qty 30, 50d supply, fill #2

## 2021-10-24 ENCOUNTER — Other Ambulatory Visit: Payer: Self-pay

## 2021-10-24 MED ORDER — METOPROLOL TARTRATE 25 MG PO TABS
ORAL_TABLET | ORAL | 1 refills | Status: DC
  Filled 2021-10-24: qty 90, 90d supply, fill #0
  Filled 2022-01-12: qty 90, 90d supply, fill #1

## 2021-11-21 ENCOUNTER — Other Ambulatory Visit: Payer: Self-pay

## 2021-11-22 ENCOUNTER — Other Ambulatory Visit: Payer: Self-pay

## 2021-11-23 ENCOUNTER — Other Ambulatory Visit: Payer: Self-pay

## 2021-12-12 ENCOUNTER — Other Ambulatory Visit: Payer: Self-pay

## 2021-12-12 MED ORDER — LINZESS 145 MCG PO CAPS
ORAL_CAPSULE | ORAL | 3 refills | Status: DC
  Filled 2021-12-12: qty 90, 90d supply, fill #0
  Filled 2022-02-16: qty 90, 90d supply, fill #1
  Filled 2022-05-31: qty 90, 90d supply, fill #2
  Filled 2022-08-29: qty 90, 90d supply, fill #3

## 2021-12-23 IMAGING — MG DIGITAL SCREENING BILAT W/ TOMO W/ CAD
8 series · 9 of 24 positions shown · non-contrast
Comparison: Previous exam(s).

CLINICAL DATA: Screening.

EXAM:
DIGITAL SCREENING BILATERAL MAMMOGRAM WITH TOMO AND CAD

[R CC synth-2D]
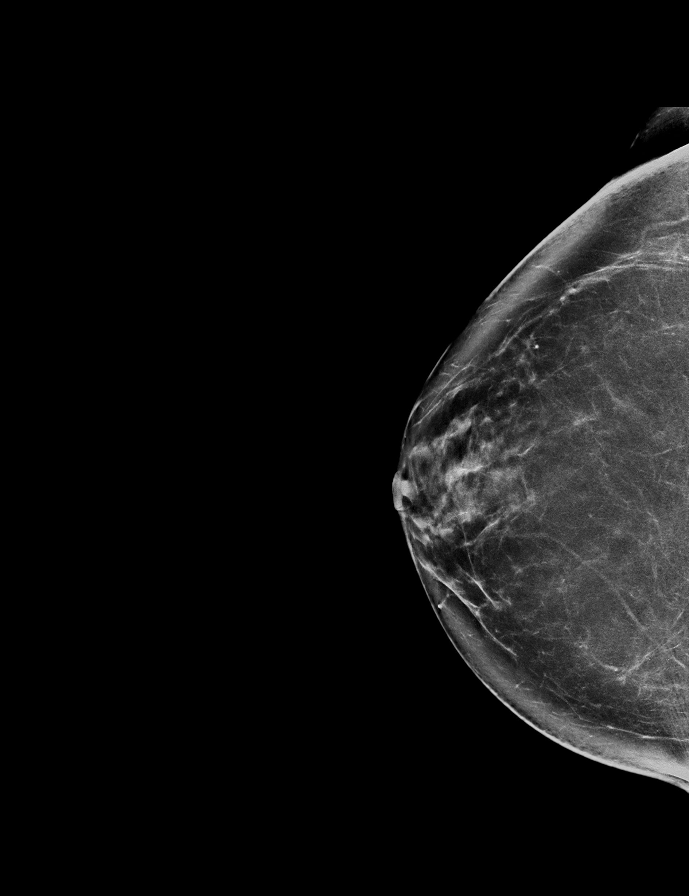

[R MLO synth-2D]
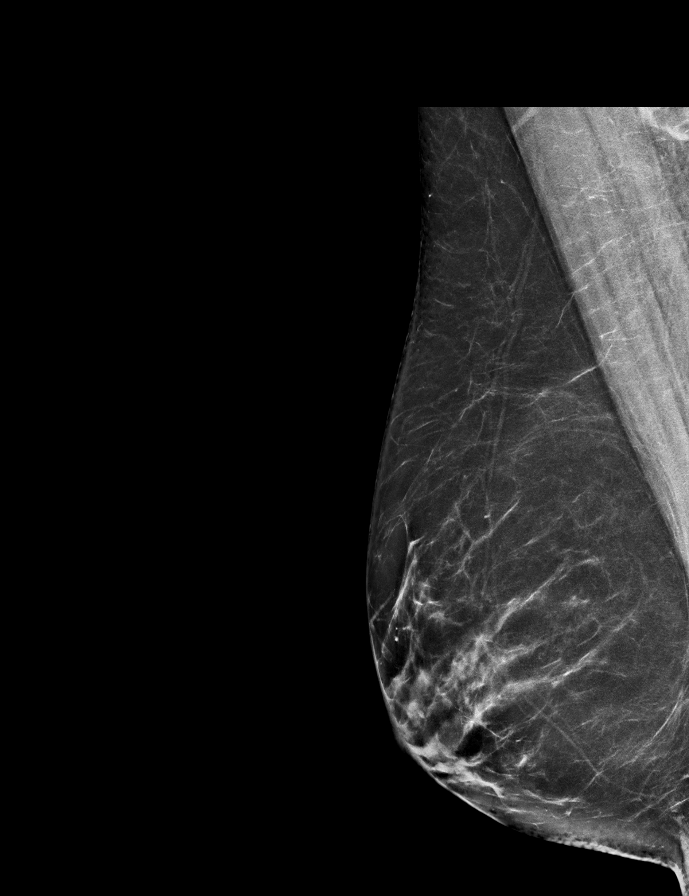

[L MLO synth-2D]
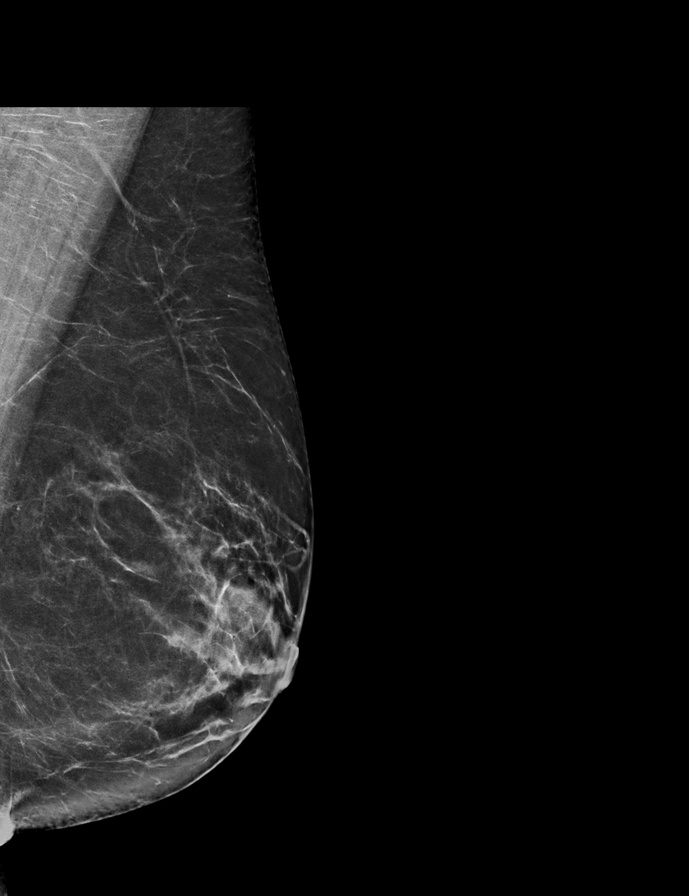

[L CC synth-2D]
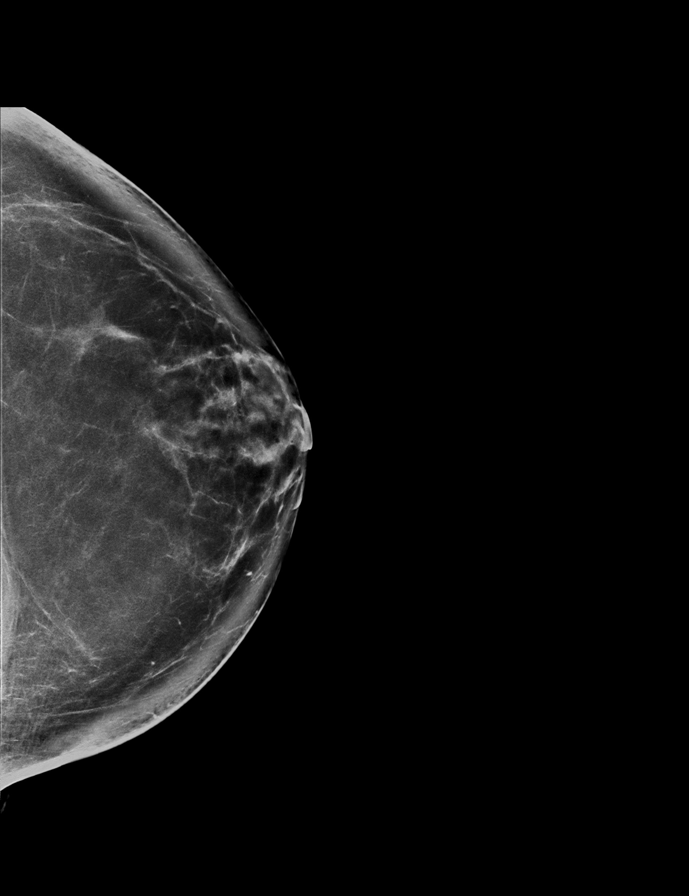

[L CC tomo · 2 of 84 frames shown]
[frame 28/84]
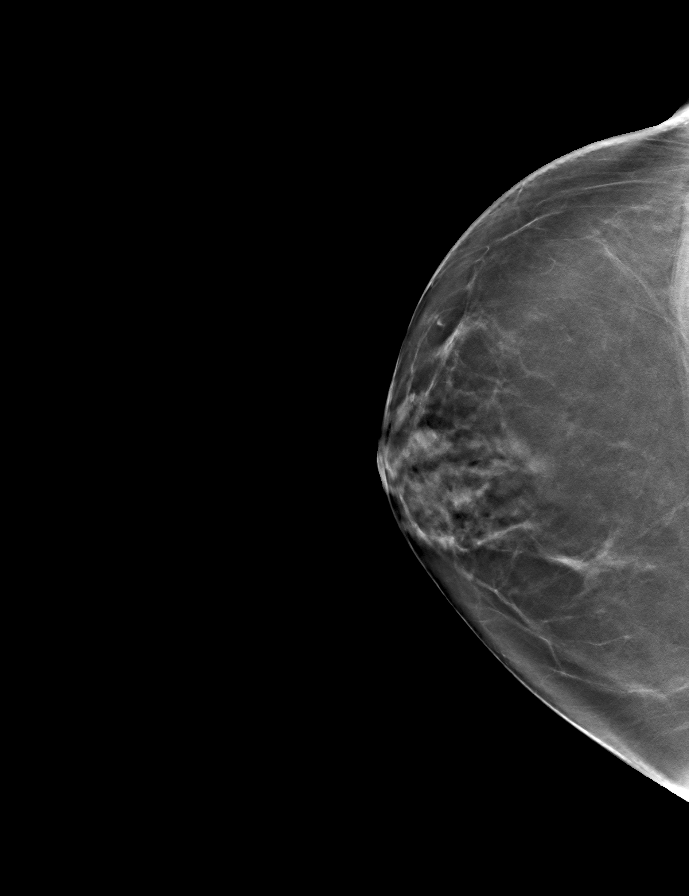
[frame 43/84]
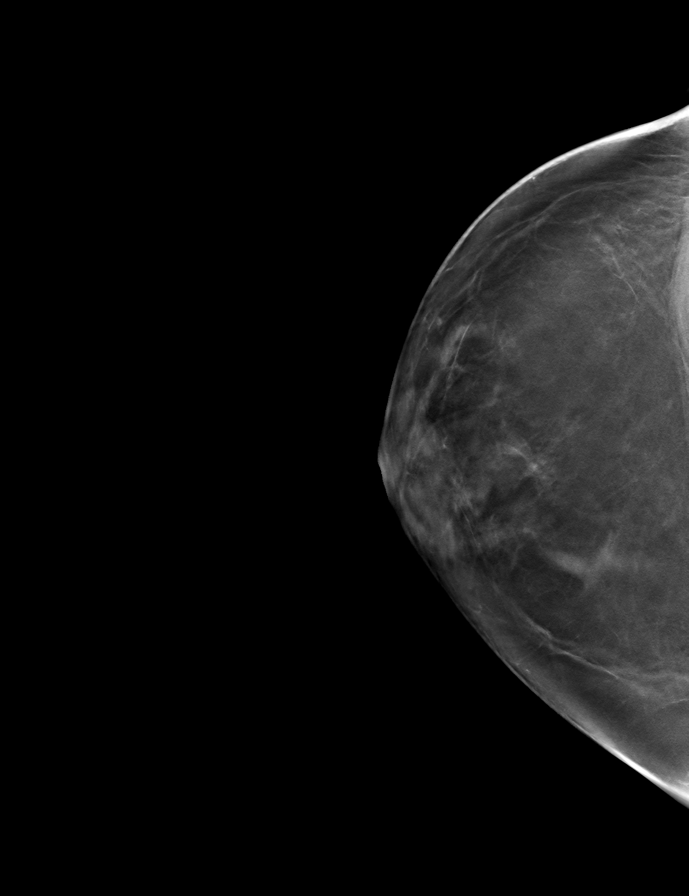

[R MLO tomo · tomo slice 39/76.0]
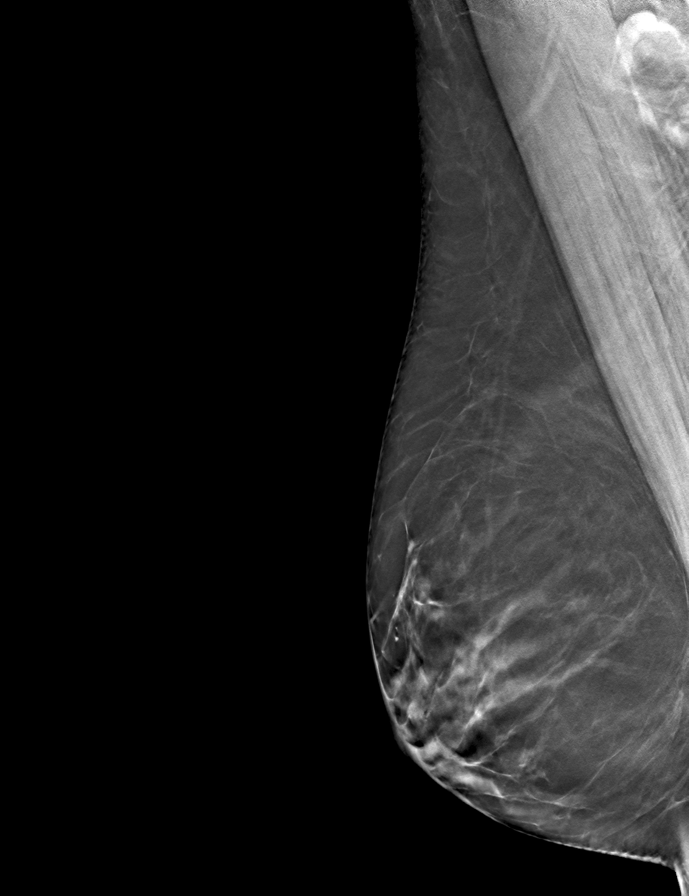

[L MLO tomo · tomo slice 37/72.0]
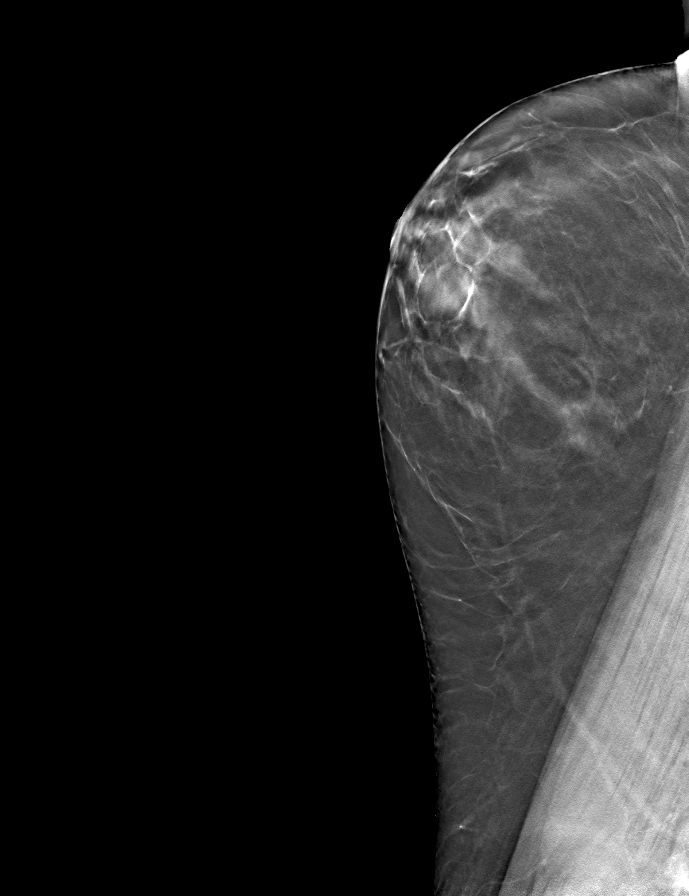

[R CC tomo · tomo slice 41/80.0]
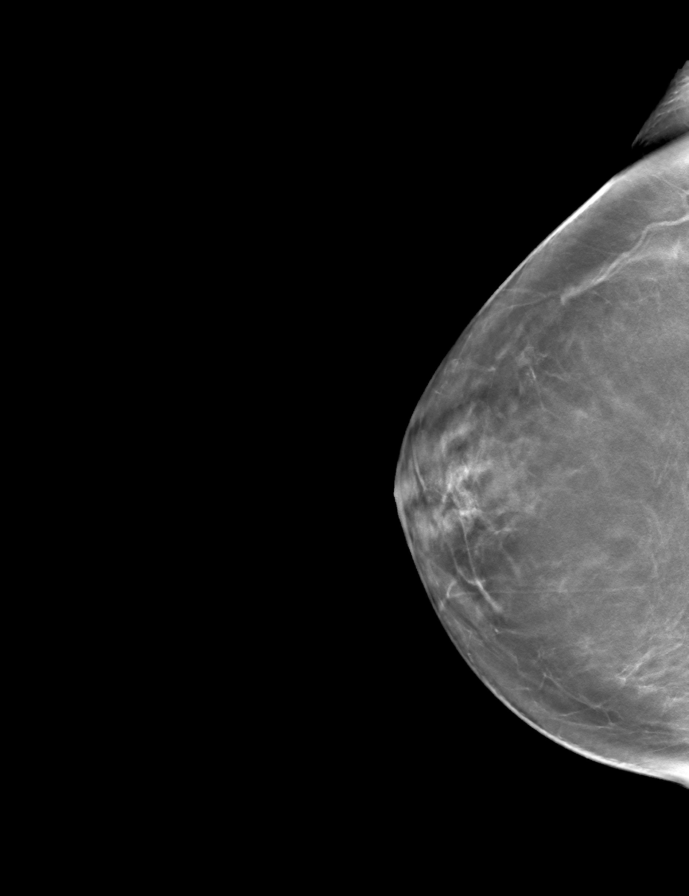

[9 of 24 positions shown; findings below may reference images not displayed]

ACR Breast Density Category b: There are scattered areas of
fibroglandular density.
FINDINGS: There are no findings suspicious for malignancy. Images were
processed with CAD.
IMPRESSION: No mammographic evidence of malignancy. A result letter of this
screening mammogram will be mailed directly to the patient.

RECOMMENDATION:
Screening mammogram in one year. (Code:[id number])

BI-RADS CATEGORY  1: Negative.

## 2021-12-26 ENCOUNTER — Other Ambulatory Visit: Payer: Self-pay

## 2021-12-26 MED ORDER — FUROSEMIDE 20 MG PO TABS
ORAL_TABLET | ORAL | 11 refills | Status: DC
  Filled 2021-12-26: qty 30, 30d supply, fill #0
  Filled 2022-01-20: qty 90, 90d supply, fill #1
  Filled 2022-04-20: qty 90, 90d supply, fill #2
  Filled 2022-07-18: qty 90, 90d supply, fill #3
  Filled 2022-10-10: qty 60, 60d supply, fill #4

## 2021-12-26 MED ORDER — POTASSIUM CHLORIDE CRYS ER 10 MEQ PO TBCR
EXTENDED_RELEASE_TABLET | ORAL | 11 refills | Status: DC
Start: 2021-12-26 — End: 2023-04-06
  Filled 2021-12-26: qty 30, 30d supply, fill #0
  Filled 2022-01-20: qty 90, 90d supply, fill #1
  Filled 2022-04-20: qty 90, 90d supply, fill #2
  Filled 2022-07-18: qty 90, 90d supply, fill #3
  Filled 2022-10-10: qty 60, 60d supply, fill #4

## 2022-01-12 ENCOUNTER — Other Ambulatory Visit: Payer: Self-pay

## 2022-01-12 MED ORDER — DICLOFENAC POTASSIUM 50 MG PO TABS
ORAL_TABLET | ORAL | 3 refills | Status: DC
  Filled 2022-01-12: qty 180, 90d supply, fill #0
  Filled 2022-04-11: qty 180, 90d supply, fill #1
  Filled 2022-07-18: qty 180, 90d supply, fill #2
  Filled 2022-10-10: qty 180, 90d supply, fill #3

## 2022-01-12 MED ORDER — HYDROXYCHLOROQUINE SULFATE 200 MG PO TABS
ORAL_TABLET | ORAL | 0 refills | Status: DC
  Filled 2022-01-12: qty 90, 90d supply, fill #0

## 2022-01-13 ENCOUNTER — Other Ambulatory Visit: Payer: Self-pay

## 2022-01-20 ENCOUNTER — Other Ambulatory Visit: Payer: Self-pay

## 2022-01-25 ENCOUNTER — Other Ambulatory Visit: Payer: Self-pay

## 2022-02-16 ENCOUNTER — Other Ambulatory Visit: Payer: Self-pay

## 2022-02-17 ENCOUNTER — Other Ambulatory Visit: Payer: Self-pay

## 2022-02-24 ENCOUNTER — Other Ambulatory Visit: Payer: Self-pay

## 2022-03-13 ENCOUNTER — Encounter (INDEPENDENT_AMBULATORY_CARE_PROVIDER_SITE_OTHER): Payer: Self-pay

## 2022-04-11 ENCOUNTER — Other Ambulatory Visit: Payer: Self-pay

## 2022-04-11 MED ORDER — METOPROLOL TARTRATE 25 MG PO TABS
ORAL_TABLET | ORAL | 1 refills | Status: DC
  Filled 2022-04-11: qty 90, 90d supply, fill #0
  Filled 2022-07-18: qty 90, 90d supply, fill #1

## 2022-04-11 MED ORDER — HYDROXYCHLOROQUINE SULFATE 200 MG PO TABS
ORAL_TABLET | ORAL | 0 refills | Status: DC
  Filled 2022-04-11: qty 90, 90d supply, fill #0

## 2022-04-13 ENCOUNTER — Other Ambulatory Visit: Payer: Self-pay

## 2022-04-14 ENCOUNTER — Other Ambulatory Visit: Payer: Self-pay

## 2022-04-17 ENCOUNTER — Other Ambulatory Visit: Payer: Self-pay

## 2022-04-17 MED ORDER — AMOXICILLIN-POT CLAVULANATE 875-125 MG PO TABS
ORAL_TABLET | ORAL | 0 refills | Status: DC
  Filled 2022-04-17: qty 14, 7d supply, fill #0

## 2022-04-20 ENCOUNTER — Other Ambulatory Visit: Payer: Self-pay

## 2022-04-20 MED ORDER — ALBUTEROL SULFATE HFA 108 (90 BASE) MCG/ACT IN AERS
INHALATION_SPRAY | RESPIRATORY_TRACT | 1 refills | Status: DC
  Filled 2022-04-20: qty 6.7, 25d supply, fill #0

## 2022-04-20 MED ORDER — PREDNISONE 20 MG PO TABS
ORAL_TABLET | ORAL | 0 refills | Status: DC
  Filled 2022-04-20: qty 5, 5d supply, fill #0

## 2022-04-24 ENCOUNTER — Other Ambulatory Visit: Payer: Self-pay

## 2022-04-24 MED ORDER — PREDNISONE 20 MG PO TABS
ORAL_TABLET | ORAL | 0 refills | Status: DC
  Filled 2022-04-24: qty 5, 5d supply, fill #0

## 2022-04-24 MED ORDER — AMOXICILLIN-POT CLAVULANATE 875-125 MG PO TABS
ORAL_TABLET | ORAL | 0 refills | Status: DC
  Filled 2022-04-24: qty 14, 7d supply, fill #0

## 2022-05-10 ENCOUNTER — Other Ambulatory Visit: Payer: Self-pay

## 2022-05-17 ENCOUNTER — Encounter: Payer: Self-pay | Admitting: Certified Nurse Midwife

## 2022-05-23 ENCOUNTER — Other Ambulatory Visit: Payer: Self-pay

## 2022-05-31 ENCOUNTER — Other Ambulatory Visit: Payer: Self-pay

## 2022-05-31 ENCOUNTER — Ambulatory Visit: Payer: 59 | Admitting: Certified Nurse Midwife

## 2022-06-26 ENCOUNTER — Other Ambulatory Visit: Payer: Self-pay | Admitting: Certified Nurse Midwife

## 2022-06-26 DIAGNOSIS — I10 Essential (primary) hypertension: Secondary | ICD-10-CM | POA: Diagnosis not present

## 2022-06-26 DIAGNOSIS — E538 Deficiency of other specified B group vitamins: Secondary | ICD-10-CM | POA: Diagnosis not present

## 2022-06-26 DIAGNOSIS — E785 Hyperlipidemia, unspecified: Secondary | ICD-10-CM | POA: Diagnosis not present

## 2022-06-26 DIAGNOSIS — Z1231 Encounter for screening mammogram for malignant neoplasm of breast: Secondary | ICD-10-CM

## 2022-06-30 ENCOUNTER — Other Ambulatory Visit: Payer: Self-pay | Admitting: Certified Nurse Midwife

## 2022-06-30 ENCOUNTER — Other Ambulatory Visit: Payer: Self-pay

## 2022-06-30 MED ORDER — PROGESTERONE 200 MG PO CAPS
200.0000 mg | ORAL_CAPSULE | Freq: Every day | ORAL | 3 refills | Status: DC
Start: 2022-06-30 — End: 2023-07-10
  Filled 2022-06-30: qty 90, 90d supply, fill #0
  Filled 2022-10-10: qty 90, 90d supply, fill #1
  Filled 2023-01-04: qty 90, 90d supply, fill #2
  Filled 2023-04-06: qty 90, 90d supply, fill #3

## 2022-07-03 DIAGNOSIS — E538 Deficiency of other specified B group vitamins: Secondary | ICD-10-CM | POA: Diagnosis not present

## 2022-07-03 DIAGNOSIS — E785 Hyperlipidemia, unspecified: Secondary | ICD-10-CM | POA: Diagnosis not present

## 2022-07-03 DIAGNOSIS — R12 Heartburn: Secondary | ICD-10-CM | POA: Diagnosis not present

## 2022-07-03 DIAGNOSIS — I471 Supraventricular tachycardia, unspecified: Secondary | ICD-10-CM | POA: Diagnosis not present

## 2022-07-03 DIAGNOSIS — M3500 Sicca syndrome, unspecified: Secondary | ICD-10-CM | POA: Diagnosis not present

## 2022-07-03 DIAGNOSIS — M329 Systemic lupus erythematosus, unspecified: Secondary | ICD-10-CM | POA: Diagnosis not present

## 2022-07-03 DIAGNOSIS — M545 Low back pain, unspecified: Secondary | ICD-10-CM | POA: Diagnosis not present

## 2022-07-03 DIAGNOSIS — M5136 Other intervertebral disc degeneration, lumbar region: Secondary | ICD-10-CM | POA: Diagnosis not present

## 2022-07-03 DIAGNOSIS — D638 Anemia in other chronic diseases classified elsewhere: Secondary | ICD-10-CM | POA: Diagnosis not present

## 2022-07-03 DIAGNOSIS — I10 Essential (primary) hypertension: Secondary | ICD-10-CM | POA: Diagnosis not present

## 2022-07-18 ENCOUNTER — Other Ambulatory Visit: Payer: Self-pay

## 2022-07-18 DIAGNOSIS — M329 Systemic lupus erythematosus, unspecified: Secondary | ICD-10-CM | POA: Diagnosis not present

## 2022-07-20 ENCOUNTER — Ambulatory Visit: Payer: Commercial Managed Care - PPO | Admitting: Certified Nurse Midwife

## 2022-07-20 ENCOUNTER — Encounter: Payer: Self-pay | Admitting: Certified Nurse Midwife

## 2022-07-20 VITALS — BP 134/81 | HR 73 | Resp 15 | Ht 65.0 in | Wt 144.2 lb

## 2022-07-20 DIAGNOSIS — Z01419 Encounter for gynecological examination (general) (routine) without abnormal findings: Secondary | ICD-10-CM | POA: Diagnosis not present

## 2022-07-20 NOTE — Progress Notes (Signed)
GYNECOLOGY ANNUAL PREVENTATIVE CARE ENCOUNTER NOTE  History:     Natalie Wheeler is a 57 y.o. G0P0 female here for a routine annual gynecologic exam.  Current complaints: non3.   Denies abnormal vaginal bleeding, discharge, pelvic pain, problems with intercourse or other gynecologic concerns.     Social Relationship:married  Living:spouse  Work:  Air traffic controller Exercise: yoga/Zumba Smoke/Alcohol/drug use: Occasional alcohol use   Gynecologic History No LMP recorded (lmp unknown). Patient is postmenopausal. Contraception: post menopausal status Last Pap: 05/04/2020. Results were: normal with negative HPV Last mammogram: 08/02/21. Results were: normal  Obstetric History OB History  Gravida Para Term Preterm AB Living  0            SAB IAB Ectopic Multiple Live Births               Past Medical History:  Diagnosis Date   Anemia    Cervicitis    Decreased libido    Degenerative disc disease, lumbar    L1-L5   Dyspareunia    Hypertension    Keratosis pilaris    Lupus ([place])    Lupus ([place])    Motion sickness    ships, back seat cars   Raynaud's disease    Raynaud's syndrome    Raynauds syndrome    Sciatica    right side   Sjogren's disease (HCC)    SVT (supraventricular tachycardia)    controlled with metoprolol   URI (upper respiratory infection)    Head cold, started last week. Antibiotic and prenisone taper rx'd on 02/12/17. Improved.   Vaginal Pap smear, abnormal     Past Surgical History:  Procedure Laterality Date   BAND HEMORRHOIDECTOMY     COLONOSCOPY WITH PROPOFOL N/A 08/03/2017   Procedure: COLONOSCOPY WITH PROPOFOL;  Surgeon: Manya Silvas, MD;  Location: [facility];  Service: Endoscopy;  Laterality: N/A;   LEEP     NASAL SEPTOPLASTY W/ TURBINOPLASTY Bilateral 02/21/2017   Procedure: NASAL SEPTOPLASTY WITH  INFERIOR TURBINATE REDUCTION;  Surgeon: Carloyn Manner, MD;  Location: [facility];  Service: ENT;  Laterality:  Bilateral;   PRK Bilateral    TONSILLECTOMY      Current Outpatient Medications on File Prior to Visit  Medication Sig Dispense Refill   baclofen (LIORESAL) 20 MG tablet Take 1 tablet (20 mg total) by mouth 3 (three) times daily as needed One by mouth at night 360 tablet 1   calcium-vitamin D (OSCAL WITH D) 250-125 MG-UNIT tablet Take 1 tablet by mouth daily.     cyanocobalamin (VITAMIN B12) 1000 MCG/ML injection Inject into the muscle.     cycloSPORINE (RESTASIS) 0.05 % ophthalmic emulsion Place 1 drop into both eyes 2 (two) times daily.     diclofenac (CATAFLAM) 50 MG tablet Take 1 tablet (50 mg total) by mouth 2 (two) times daily as needed 182 tablet 3   furosemide (LASIX) 20 MG tablet Take 1 tablet (20 mg total) by mouth once daily as needed for Edema 30 tablet 11   hydroxychloroquine (PLAQUENIL) 200 MG tablet Take 1 tablet (200 mg total) by mouth once daily 90 tablet 0   linaclotide (LINZESS) 145 MCG CAPS capsule Take 1 capsule (145 mcg total) by mouth once daily Take 30 mins before food 90 capsule 3   loratadine (CLARITIN) 10 MG tablet Take 10 mg by mouth daily.     magnesium 30 MG tablet Take 30 mg by mouth 2 (two) times daily.  metoprolol tartrate (LOPRESSOR) 25 MG tablet Take 25 mg by mouth daily.     Multiple Vitamin (MULTIVITAMIN) tablet Take 1 tablet by mouth daily.     Omega-3 Fatty Acids (FISH OIL) 1000 MG CAPS Take by mouth.     potassium chloride (KLOR-CON M) 10 MEQ tablet Take 1 tablet (10 mEq total) by mouth once daily as needed (with furosemide) 30 tablet 11   progesterone (PROMETRIUM) 200 MG capsule Take 1 capsule (200 mg total) by mouth daily. 90 capsule 3   QUEtiapine (SEROQUEL) 25 MG tablet Take 1 tablet (25 mg total) by mouth at bedtime 30 tablet 11   No current facility-administered medications on file prior to visit.    Allergies  Allergen Reactions   Floxin [Ofloxacin] Shortness Of Breath   Buspirone Nausea Only   Ciprofloxacin    Doxycycline    Mobic  [Meloxicam]    Nifedipine     Hot flashes   Sulfadiazine    Tramadol Nausea Only   Zofran [Ondansetron Hcl]    Sulfa Antibiotics Rash    Social History:  reports that she quit smoking about 31 years ago. Her smoking use included cigarettes. She has never used smokeless tobacco. She reports current alcohol use of about 3.0 standard drinks of alcohol per week. She reports that she does not use drugs.  Family History  Problem Relation Age of Onset   Breast cancer Paternal Aunt 9   Diabetes Maternal Grandmother    Heart disease Maternal Grandfather    Heart disease Paternal Grandmother    Cancer Maternal Aunt        ovarian   Cirrhosis Father     The following portions of the patient's history were reviewed and updated as appropriate: allergies, current medications, past family history, past medical history, past social history, past surgical history and problem list.  Review of Systems Pertinent items noted in HPI and remainder of comprehensive ROS otherwise negative.  Physical Exam:  BP 134/81   Pulse 73   Resp 15   Ht '5\' 5"'$  (1.651 m)   Wt 144 lb 3.2 oz (65.4 kg)   LMP  (LMP Unknown)   BMI 24.00 kg/m  CONSTITUTIONAL: Well-developed, well-nourished female in no acute distress.  HENT:  Normocephalic, atraumatic, External right and left ear normal. Oropharynx is clear and moist EYES: Conjunctivae and EOM are normal. Pupils are equal, round, and reactive to light. No scleral icterus.  NECK: Normal range of motion, supple, no masses.  Normal thyroid.  SKIN: Skin is warm and dry. No rash noted. Not diaphoretic. No erythema. No pallor. MUSCULOSKELETAL: Normal range of motion. No tenderness.  No cyanosis, clubbing, or edema.  2+ distal pulses. NEUROLOGIC: Alert and oriented to person, place, and time. Normal reflexes, muscle tone coordination.  PSYCHIATRIC: Normal mood and affect. Normal behavior. Normal judgment and thought content. CARDIOVASCULAR: Normal heart rate noted, regular  rhythm RESPIRATORY: Clear to auscultation bilaterally. Effort and breath sounds normal, no problems with respiration noted. BREASTS: Symmetric in size. No masses, tenderness, skin changes, nipple drainage, or lymphadenopathy bilaterally.  ABDOMEN: Soft, no distention noted.  No tenderness, rebound or guarding.  PELVIC: pt declines exam, pap not due,denies any issues /concerns   Assessment and Plan:    1. Women's annual routine gynecological examination  Pap: not due  Mammogram :scheduled 07/31/22 Labs: none Refills:  none Referral: none  Routine preventative health maintenance measures emphasized. Please refer to After Visit Summary for other counseling recommendations.      Deneise Lever  Zakariye Nee, [facility],  [facility]

## 2022-07-20 NOTE — Patient Instructions (Signed)
Preventive Care 40-58 Years Old, Female Preventive care refers to lifestyle choices and visits with your health care provider that can promote health and wellness. Preventive care visits are also called wellness exams. What can I expect for my preventive care visit? Counseling Your health care provider may ask you questions about your: Medical history, including: Past medical problems. Family medical history. Pregnancy history. Current health, including: Menstrual cycle. Method of birth control. Emotional well-being. Home life and relationship well-being. Sexual activity and sexual health. Lifestyle, including: Alcohol, nicotine or tobacco, and drug use. Access to firearms. Diet, exercise, and sleep habits. Work and work environment. Sunscreen use. Safety issues such as seatbelt and bike helmet use. Physical exam Your health care provider will check your: Height and weight. These may be used to calculate your BMI (body mass index). BMI is a measurement that tells if you are at a healthy weight. Waist circumference. This measures the distance around your waistline. This measurement also tells if you are at a healthy weight and may help predict your risk of certain diseases, such as type 2 diabetes and high blood pressure. Heart rate and blood pressure. Body temperature. Skin for abnormal spots. What immunizations do I need?  Vaccines are usually given at various ages, according to a schedule. Your health care provider will recommend vaccines for you based on your age, medical history, and lifestyle or other factors, such as travel or where you work. What tests do I need? Screening Your health care provider may recommend screening tests for certain conditions. This may include: Lipid and cholesterol levels. Diabetes screening. This is done by checking your blood sugar (glucose) after you have not eaten for a while (fasting). Pelvic exam and Pap test. Hepatitis B test. Hepatitis C  test. HIV (human immunodeficiency virus) test. STI (sexually transmitted infection) testing, if you are at risk. Lung cancer screening. Colorectal cancer screening. Mammogram. Talk with your health care provider about when you should start having regular mammograms. This may depend on whether you have a family history of breast cancer. BRCA-related cancer screening. This may be done if you have a family history of breast, ovarian, tubal, or peritoneal cancers. Bone density scan. This is done to screen for osteoporosis. Talk with your health care provider about your test results, treatment options, and if necessary, the need for more tests. Follow these instructions at home: Eating and drinking  Eat a diet that includes fresh fruits and vegetables, whole grains, lean protein, and low-fat dairy products. Take vitamin and mineral supplements as recommended by your health care provider. Do not drink alcohol if: Your health care provider tells you not to drink. You are pregnant, may be pregnant, or are planning to become pregnant. If you drink alcohol: Limit how much you have to 0-1 drink a day. Know how much alcohol is in your drink. In the [place] one drink equals one 12 oz bottle of beer (355 mL), one 5 oz glass of wine (148 mL), or one 1 oz glass of hard liquor (44 mL). Lifestyle Brush your teeth every morning and night with fluoride toothpaste. Floss one time each day. Exercise for at least 30 minutes 5 or more days each week. Do not use any products that contain nicotine or tobacco. These products include cigarettes, chewing tobacco, and vaping devices, such as e-cigarettes. If you need help quitting, ask your health care provider. Do not use drugs. If you are sexually active, practice safe sex. Use a condom or other form of protection to   prevent STIs. If you do not wish to become pregnant, use a form of birth control. If you plan to become pregnant, see your health care provider for a  prepregnancy visit. Take aspirin only as told by your health care provider. Make sure that you understand how much to take and what form to take. Work with your health care provider to find out whether it is safe and beneficial for you to take aspirin daily. Find healthy ways to manage stress, such as: Meditation, yoga, or listening to music. Journaling. Talking to a trusted person. Spending time with friends and family. Minimize exposure to UV radiation to reduce your risk of skin cancer. Safety Always wear your seat belt while driving or riding in a vehicle. Do not drive: If you have been drinking alcohol. Do not ride with someone who has been drinking. When you are tired or distracted. While texting. If you have been using any mind-altering substances or drugs. Wear a helmet and other protective equipment during sports activities. If you have firearms in your house, make sure you follow all gun safety procedures. Seek help if you have been physically or sexually abused. What's next? Visit your health care provider once a year for an annual wellness visit. Ask your health care provider how often you should have your eyes and teeth checked. Stay up to date on all vaccines. This information is not intended to replace advice given to you by your health care provider. Make sure you discuss any questions you have with your health care provider. Document Revised: 10/27/2020 Document Reviewed: 10/27/2020 Elsevier Patient Education  2023 Elsevier Inc.  

## 2022-07-31 ENCOUNTER — Ambulatory Visit
Admission: RE | Admit: 2022-07-31 | Discharge: 2022-07-31 | Disposition: A | Discharge status: Home / Self Care | Payer: Commercial Managed Care - PPO | Source: Ambulatory Visit | Attending: Certified Nurse Midwife | Admitting: Certified Nurse Midwife

## 2022-07-31 DIAGNOSIS — Z1231 Encounter for screening mammogram for malignant neoplasm of breast: Secondary | ICD-10-CM | POA: Insufficient documentation

## 2022-08-07 ENCOUNTER — Other Ambulatory Visit: Payer: Self-pay

## 2022-08-07 DIAGNOSIS — Z79899 Other long term (current) drug therapy: Secondary | ICD-10-CM | POA: Diagnosis not present

## 2022-08-07 DIAGNOSIS — M3500 Sicca syndrome, unspecified: Secondary | ICD-10-CM | POA: Diagnosis not present

## 2022-08-07 MED ORDER — HYDROXYCHLOROQUINE SULFATE 200 MG PO TABS
200.0000 mg | ORAL_TABLET | Freq: Every day | ORAL | 1 refills | Status: DC
  Filled 2022-08-07: qty 90, 90d supply, fill #0
  Filled 2022-11-01: qty 90, 90d supply, fill #1

## 2022-08-29 ENCOUNTER — Other Ambulatory Visit: Payer: Self-pay

## 2022-10-03 DIAGNOSIS — E538 Deficiency of other specified B group vitamins: Secondary | ICD-10-CM | POA: Diagnosis not present

## 2022-10-10 ENCOUNTER — Other Ambulatory Visit: Payer: Self-pay

## 2022-10-10 DIAGNOSIS — I471 Supraventricular tachycardia, unspecified: Secondary | ICD-10-CM | POA: Diagnosis not present

## 2022-10-10 DIAGNOSIS — E785 Hyperlipidemia, unspecified: Secondary | ICD-10-CM | POA: Diagnosis not present

## 2022-10-10 DIAGNOSIS — E538 Deficiency of other specified B group vitamins: Secondary | ICD-10-CM | POA: Diagnosis not present

## 2022-10-10 DIAGNOSIS — Z0000 Encounter for general adult medical examination without abnormal findings: Secondary | ICD-10-CM | POA: Diagnosis not present

## 2022-10-10 DIAGNOSIS — D638 Anemia in other chronic diseases classified elsewhere: Secondary | ICD-10-CM | POA: Diagnosis not present

## 2022-10-10 DIAGNOSIS — M329 Systemic lupus erythematosus, unspecified: Secondary | ICD-10-CM | POA: Diagnosis not present

## 2022-10-10 DIAGNOSIS — M5136 Other intervertebral disc degeneration, lumbar region: Secondary | ICD-10-CM | POA: Diagnosis not present

## 2022-10-10 DIAGNOSIS — M3500 Sicca syndrome, unspecified: Secondary | ICD-10-CM | POA: Diagnosis not present

## 2022-10-10 DIAGNOSIS — D863 Sarcoidosis of skin: Secondary | ICD-10-CM | POA: Diagnosis not present

## 2022-10-10 DIAGNOSIS — I10 Essential (primary) hypertension: Secondary | ICD-10-CM | POA: Diagnosis not present

## 2022-10-10 MED ORDER — FUROSEMIDE 20 MG PO TABS
20.0000 mg | ORAL_TABLET | Freq: Every day | ORAL | 3 refills | Status: DC | PRN
  Filled 2022-10-10: qty 90, 90d supply, fill #0
  Filled 2023-01-04: qty 90, 90d supply, fill #1
  Filled 2023-03-09: qty 90, 90d supply, fill #2

## 2022-10-10 MED ORDER — POTASSIUM CHLORIDE CRYS ER 10 MEQ PO TBCR
10.0000 meq | EXTENDED_RELEASE_TABLET | Freq: Every day | ORAL | 3 refills | Status: DC | PRN
  Filled 2022-10-10: qty 90, 90d supply, fill #0
  Filled 2023-01-04: qty 90, 90d supply, fill #1

## 2022-10-10 MED ORDER — CYCLOSPORINE 0.05 % OP EMUL
1.0000 [drp] | Freq: Two times a day (BID) | OPHTHALMIC | 6 refills | Status: DC | PRN
  Filled 2022-10-10: qty 180, 90d supply, fill #0
  Filled 2023-01-24: qty 180, 90d supply, fill #1

## 2022-10-11 ENCOUNTER — Other Ambulatory Visit: Payer: Self-pay

## 2022-11-01 ENCOUNTER — Other Ambulatory Visit: Payer: Self-pay

## 2022-11-02 ENCOUNTER — Other Ambulatory Visit: Payer: Self-pay

## 2022-11-02 MED ORDER — METOPROLOL TARTRATE 25 MG PO TABS
12.5000 mg | ORAL_TABLET | Freq: Two times a day (BID) | ORAL | 1 refills | Status: DC
  Filled 2022-11-02: qty 90, 90d supply, fill #0
  Filled 2023-01-24: qty 90, 90d supply, fill #1

## 2022-11-23 ENCOUNTER — Other Ambulatory Visit: Payer: Self-pay

## 2022-11-23 MED ORDER — LINZESS 145 MCG PO CAPS
145.0000 ug | ORAL_CAPSULE | Freq: Every day | ORAL | 3 refills | Status: DC
  Filled 2022-11-23: qty 90, 90d supply, fill #0
  Filled 2023-03-09: qty 90, 90d supply, fill #1
  Filled 2023-06-10: qty 90, 90d supply, fill #2
  Filled 2023-09-07: qty 90, 90d supply, fill #3

## 2022-11-24 ENCOUNTER — Other Ambulatory Visit: Payer: Self-pay

## 2023-01-04 ENCOUNTER — Other Ambulatory Visit: Payer: Self-pay

## 2023-01-04 MED ORDER — BACLOFEN 20 MG PO TABS
20.0000 mg | ORAL_TABLET | Freq: Three times a day (TID) | ORAL | 1 refills | Status: AC | PRN
  Filled 2023-01-04: qty 360, 90d supply, fill #0

## 2023-01-24 ENCOUNTER — Other Ambulatory Visit: Payer: Self-pay

## 2023-01-24 MED ORDER — AZELASTINE HCL 0.1 % NA SOLN
1.0000 | Freq: Two times a day (BID) | NASAL | 11 refills | Status: AC | PRN
  Filled 2023-01-24: qty 30, 90d supply, fill #0
  Filled 2023-05-17: qty 30, 90d supply, fill #1
  Filled 2023-08-28: qty 30, 90d supply, fill #2
  Filled 2023-12-06: qty 90, 90d supply, fill #3

## 2023-01-24 MED ORDER — HYDROXYCHLOROQUINE SULFATE 200 MG PO TABS
200.0000 mg | ORAL_TABLET | Freq: Every day | ORAL | 1 refills | Status: DC
  Filled 2023-01-24: qty 90, 90d supply, fill #0
  Filled 2023-05-02: qty 90, 90d supply, fill #1

## 2023-01-24 MED ORDER — DICLOFENAC POTASSIUM 50 MG PO TABS
50.0000 mg | ORAL_TABLET | Freq: Two times a day (BID) | ORAL | 3 refills | Status: DC | PRN
  Filled 2023-01-24: qty 180, 90d supply, fill #0
  Filled 2023-05-17: qty 180, 90d supply, fill #1

## 2023-01-25 ENCOUNTER — Other Ambulatory Visit: Payer: Self-pay

## 2023-02-07 DIAGNOSIS — M3500 Sicca syndrome, unspecified: Secondary | ICD-10-CM | POA: Diagnosis not present

## 2023-02-07 DIAGNOSIS — Z79899 Other long term (current) drug therapy: Secondary | ICD-10-CM | POA: Diagnosis not present

## 2023-02-19 ENCOUNTER — Other Ambulatory Visit: Payer: Self-pay

## 2023-02-19 MED ORDER — AMOXICILLIN-POT CLAVULANATE 875-125 MG PO TABS
1.0000 | ORAL_TABLET | Freq: Two times a day (BID) | ORAL | 0 refills | Status: DC
  Filled 2023-02-19: qty 20, 10d supply, fill #0

## 2023-02-19 MED ORDER — PREDNISONE 10 MG PO TABS
ORAL_TABLET | ORAL | 0 refills | Status: AC
  Filled 2023-02-19: qty 30, 12d supply, fill #0

## 2023-03-09 ENCOUNTER — Other Ambulatory Visit: Payer: Self-pay

## 2023-04-02 DIAGNOSIS — I10 Essential (primary) hypertension: Secondary | ICD-10-CM | POA: Diagnosis not present

## 2023-04-02 DIAGNOSIS — E785 Hyperlipidemia, unspecified: Secondary | ICD-10-CM | POA: Diagnosis not present

## 2023-04-02 DIAGNOSIS — E538 Deficiency of other specified B group vitamins: Secondary | ICD-10-CM | POA: Diagnosis not present

## 2023-04-06 ENCOUNTER — Other Ambulatory Visit: Payer: Self-pay

## 2023-04-06 MED ORDER — FUROSEMIDE 20 MG PO TABS
20.0000 mg | ORAL_TABLET | Freq: Every day | ORAL | 11 refills | Status: DC
  Filled 2023-04-06: qty 30, 30d supply, fill #0
  Filled 2023-05-07: qty 90, 90d supply, fill #1
  Filled 2023-08-03: qty 90, 90d supply, fill #2

## 2023-04-06 MED ORDER — POTASSIUM CHLORIDE CRYS ER 10 MEQ PO TBCR
10.0000 meq | EXTENDED_RELEASE_TABLET | Freq: Every day | ORAL | 11 refills | Status: DC | PRN
  Filled 2023-04-06: qty 30, 30d supply, fill #0
  Filled 2023-05-07: qty 90, 90d supply, fill #1
  Filled 2023-08-03: qty 90, 90d supply, fill #2

## 2023-04-09 ENCOUNTER — Other Ambulatory Visit: Payer: Self-pay

## 2023-04-09 DIAGNOSIS — M329 Systemic lupus erythematosus, unspecified: Secondary | ICD-10-CM | POA: Diagnosis not present

## 2023-04-09 DIAGNOSIS — I10 Essential (primary) hypertension: Secondary | ICD-10-CM | POA: Diagnosis not present

## 2023-04-09 DIAGNOSIS — Z131 Encounter for screening for diabetes mellitus: Secondary | ICD-10-CM | POA: Diagnosis not present

## 2023-04-09 DIAGNOSIS — D638 Anemia in other chronic diseases classified elsewhere: Secondary | ICD-10-CM | POA: Diagnosis not present

## 2023-04-09 DIAGNOSIS — D863 Sarcoidosis of skin: Secondary | ICD-10-CM | POA: Diagnosis not present

## 2023-04-09 DIAGNOSIS — E785 Hyperlipidemia, unspecified: Secondary | ICD-10-CM | POA: Diagnosis not present

## 2023-04-09 DIAGNOSIS — E538 Deficiency of other specified B group vitamins: Secondary | ICD-10-CM | POA: Diagnosis not present

## 2023-04-09 DIAGNOSIS — I471 Supraventricular tachycardia, unspecified: Secondary | ICD-10-CM | POA: Diagnosis not present

## 2023-04-09 DIAGNOSIS — M51362 Other intervertebral disc degeneration, lumbar region with discogenic back pain and lower extremity pain: Secondary | ICD-10-CM | POA: Diagnosis not present

## 2023-04-09 DIAGNOSIS — M3500 Sicca syndrome, unspecified: Secondary | ICD-10-CM | POA: Diagnosis not present

## 2023-04-09 MED ORDER — MUPIROCIN 2 % EX OINT
TOPICAL_OINTMENT | Freq: Two times a day (BID) | CUTANEOUS | 11 refills | Status: DC
  Filled 2023-04-09: qty 22, 30d supply, fill #0

## 2023-05-02 ENCOUNTER — Other Ambulatory Visit: Payer: Self-pay

## 2023-05-03 ENCOUNTER — Other Ambulatory Visit: Payer: Self-pay

## 2023-05-03 MED ORDER — METOPROLOL TARTRATE 25 MG PO TABS
12.5000 mg | ORAL_TABLET | Freq: Two times a day (BID) | ORAL | 1 refills | Status: DC
  Filled 2023-05-03: qty 90, 90d supply, fill #0
  Filled 2023-07-28: qty 90, 90d supply, fill #1

## 2023-05-07 ENCOUNTER — Other Ambulatory Visit: Payer: Self-pay

## 2023-07-01 ENCOUNTER — Other Ambulatory Visit: Payer: Self-pay | Admitting: Certified Nurse Midwife

## 2023-07-02 ENCOUNTER — Other Ambulatory Visit: Payer: Self-pay | Admitting: Certified Nurse Midwife

## 2023-07-02 ENCOUNTER — Other Ambulatory Visit: Payer: Self-pay

## 2023-07-03 ENCOUNTER — Other Ambulatory Visit: Payer: Self-pay

## 2023-07-07 ENCOUNTER — Other Ambulatory Visit: Payer: Self-pay | Admitting: Certified Nurse Midwife

## 2023-07-09 ENCOUNTER — Other Ambulatory Visit: Payer: Self-pay

## 2023-07-10 ENCOUNTER — Other Ambulatory Visit: Payer: Self-pay

## 2023-07-10 ENCOUNTER — Telehealth: Payer: Self-pay

## 2023-07-10 MED ORDER — PROGESTERONE 200 MG PO CAPS
200.0000 mg | ORAL_CAPSULE | Freq: Every day | ORAL | 0 refills | Status: DC
  Filled 2023-07-10: qty 60, 60d supply, fill #0

## 2023-07-10 NOTE — Telephone Encounter (Signed)
 Natalie Wheeler called triage line requesting a refill of her progesterone. She scheduled her annual in April

## 2023-07-17 ENCOUNTER — Other Ambulatory Visit: Payer: Self-pay | Admitting: Certified Nurse Midwife

## 2023-07-17 DIAGNOSIS — Z1231 Encounter for screening mammogram for malignant neoplasm of breast: Secondary | ICD-10-CM

## 2023-07-23 DIAGNOSIS — M329 Systemic lupus erythematosus, unspecified: Secondary | ICD-10-CM | POA: Diagnosis not present

## 2023-07-23 DIAGNOSIS — Z79899 Other long term (current) drug therapy: Secondary | ICD-10-CM | POA: Diagnosis not present

## 2023-08-01 ENCOUNTER — Other Ambulatory Visit: Payer: Self-pay

## 2023-08-07 ENCOUNTER — Other Ambulatory Visit: Payer: Self-pay

## 2023-08-07 DIAGNOSIS — Z79899 Other long term (current) drug therapy: Secondary | ICD-10-CM | POA: Diagnosis not present

## 2023-08-07 DIAGNOSIS — M3500 Sicca syndrome, unspecified: Secondary | ICD-10-CM | POA: Diagnosis not present

## 2023-08-07 MED ORDER — HYDROXYCHLOROQUINE SULFATE 200 MG PO TABS
200.0000 mg | ORAL_TABLET | Freq: Every day | ORAL | 1 refills | Status: DC
  Filled 2023-08-07: qty 90, 90d supply, fill #0

## 2023-08-10 ENCOUNTER — Ambulatory Visit
Admission: RE | Admit: 2023-08-10 | Discharge: 2023-08-10 | Disposition: A | Discharge status: Home / Self Care | Source: Ambulatory Visit | Attending: Certified Nurse Midwife | Admitting: Certified Nurse Midwife

## 2023-08-10 DIAGNOSIS — Z1231 Encounter for screening mammogram for malignant neoplasm of breast: Secondary | ICD-10-CM | POA: Diagnosis not present

## 2023-08-21 ENCOUNTER — Other Ambulatory Visit: Payer: Self-pay

## 2023-08-21 MED ORDER — PREDNISONE 5 MG PO TABS
ORAL_TABLET | ORAL | 0 refills | Status: AC
  Filled 2023-08-21: qty 30, 12d supply, fill #0

## 2023-08-24 DIAGNOSIS — R899 Unspecified abnormal finding in specimens from other organs, systems and tissues: Secondary | ICD-10-CM | POA: Diagnosis not present

## 2023-08-29 ENCOUNTER — Encounter: Payer: Self-pay | Admitting: Certified Nurse Midwife

## 2023-08-29 ENCOUNTER — Other Ambulatory Visit: Payer: Self-pay

## 2023-08-29 ENCOUNTER — Ambulatory Visit: Payer: Commercial Managed Care - PPO | Admitting: Certified Nurse Midwife

## 2023-08-29 VITALS — BP 133/81 | HR 85 | Ht 65.0 in | Wt 144.9 lb

## 2023-08-29 DIAGNOSIS — Z1231 Encounter for screening mammogram for malignant neoplasm of breast: Secondary | ICD-10-CM

## 2023-08-29 DIAGNOSIS — R232 Flushing: Secondary | ICD-10-CM | POA: Insufficient documentation

## 2023-08-29 DIAGNOSIS — Z01419 Encounter for gynecological examination (general) (routine) without abnormal findings: Secondary | ICD-10-CM | POA: Diagnosis not present

## 2023-08-29 MED ORDER — PROGESTERONE 200 MG PO CAPS
200.0000 mg | ORAL_CAPSULE | Freq: Every day | ORAL | 4 refills | Status: DC
  Filled 2023-08-29: qty 36, 36d supply, fill #0

## 2023-08-29 NOTE — Progress Notes (Signed)
 GYNECOLOGY ANNUAL PREVENTATIVE CARE ENCOUNTER NOTE  History:     Natalie Wheeler is a 58 y.o. G0P0 female here for a routine annual gynecologic exam.  Current complaints: none.   Denies abnormal vaginal bleeding, discharge, pelvic pain, problems with intercourse or other gynecologic concerns.     Social Relationship:partner Living:with partner Work:full time Training and development officer) Exercise:3-4x a week Smoke/Alcohol/drug use:  Gynecologic History No LMP recorded (lmp unknown). Patient is postmenopausal. Contraception: abstinence Last Pap: 05/04/2020. Results were: normal with negative HPV Last mammogram: 08/10/2023. Results were: normal  Obstetric History OB History  Gravida Para Term Preterm AB Living  0       SAB IAB Ectopic Multiple Live Births          Past Medical History:  Diagnosis Date   Anemia    Cervicitis    Decreased libido    Degenerative disc disease, lumbar    L1-L5   Dyspareunia    Hypertension    Keratosis pilaris    Lupus    Lupus    Motion sickness    ships, back seat cars   Raynaud's disease    Raynaud's syndrome    Raynauds syndrome    Sciatica    right side   Sjogren's disease (HCC)    SVT (supraventricular tachycardia) (HCC)    controlled with metoprolol   URI (upper respiratory infection)    Head cold, started last week. Antibiotic and prenisone taper rx'd on 02/12/17. Improved.   Vaginal Pap smear, abnormal     Past Surgical History:  Procedure Laterality Date   BAND HEMORRHOIDECTOMY     COLONOSCOPY WITH PROPOFOL N/A 08/03/2017   Procedure: COLONOSCOPY WITH PROPOFOL;  Surgeon: Scot Jun, MD;  Location: [facility] ENDOSCOPY;  Service: Endoscopy;  Laterality: N/A;   LEEP     NASAL SEPTOPLASTY W/ TURBINOPLASTY Bilateral 02/21/2017   Procedure: NASAL SEPTOPLASTY WITH  INFERIOR TURBINATE REDUCTION;  Surgeon: Bud Face, MD;  Location: [facility];  Service: ENT;  Laterality: Bilateral;   PRK Bilateral     TONSILLECTOMY      Current Outpatient Medications on File Prior to Visit  Medication Sig Dispense Refill   azelastine (ASTELIN) 0.1 % nasal spray Place 1 spray into both nostrils 2 (two) times daily as needed for runny nose. 30 mL 11   baclofen (LIORESAL) 20 MG tablet Take 1 tablet (20 mg total) by mouth 3 (three) times daily as needed and 1 tablet at night 360 tablet 1   calcium-vitamin D (OSCAL WITH D) 250-125 MG-UNIT tablet Take 1 tablet by mouth daily.     cyanocobalamin (VITAMIN B12) 1000 MCG/ML injection Inject into the muscle.     linaclotide (LINZESS) 145 MCG CAPS capsule Take 1 capsule (145 mcg total) by mouth daily. 90 capsule 3   loratadine (CLARITIN) 10 MG tablet Take 10 mg by mouth daily.     magnesium 30 MG tablet Take 30 mg by mouth 2 (two) times daily.     Multiple Vitamin (MULTIVITAMIN) tablet Take 1 tablet by mouth daily.     Omega-3 Fatty Acids (FISH OIL) 1000 MG CAPS Take by mouth.     predniSONE (DELTASONE) 5 MG tablet Take 4 tablets (20 mg total) by mouth daily for 3 days, THEN 3 tablets (15 mg total) daily for 3 days, THEN 2 tablets (10 mg total) daily for 3 days, THEN 1 tablet (5 mg total) daily for 3 days. Take in the mornings. 30 tablet  0   progesterone (PROMETRIUM) 200 MG capsule Take 1 capsule (200 mg total) by mouth daily. 60 capsule 0   No current facility-administered medications on file prior to visit.    Allergies  Allergen Reactions   Floxin [Ofloxacin] Shortness Of Breath   Buspirone Nausea Only   Ciprofloxacin    Doxycycline    Mobic [Meloxicam]    Nifedipine     Hot flashes   Sulfadiazine    Tramadol Nausea Only   Zofran [Ondansetron Hcl]    Sulfa Antibiotics Rash    Social History:  reports that she quit smoking about 32 years ago. Her smoking use included cigarettes. She has never used smokeless tobacco. She reports current alcohol use of about 3.0 standard drinks of alcohol per week. She reports that she does not use drugs.  Family History   Problem Relation Age of Onset   Breast cancer Paternal Aunt 51   Diabetes Maternal Grandmother    Heart disease Maternal Grandfather    Heart disease Paternal Grandmother    Cancer Maternal Aunt        ovarian   Cirrhosis Father     The following portions of the patient's history were reviewed and updated as appropriate: allergies, current medications, past family history, past medical history, past social history, past surgical history and problem list.  Review of Systems Pertinent items noted in HPI and remainder of comprehensive ROS otherwise negative.  Physical Exam:  BP 133/81   Pulse 85   Ht 5\' 5"  (1.651 m)   Wt 144 lb 14.4 oz (65.7 kg)   LMP  (LMP Unknown)   BMI 24.11 kg/m  CONSTITUTIONAL: Well-developed, well-nourished female in no acute distress.  HENT:  Normocephalic, atraumatic, External right and left ear normal. Oropharynx is clear and moist EYES: Conjunctivae and EOM are normal. Pupils are equal, round, and reactive to light. No scleral icterus.  NECK: Normal range of motion, supple, no masses.  Normal thyroid.  SKIN: Skin is warm and dry. No rash noted. Not diaphoretic. No erythema. No pallor. MUSCULOSKELETAL: Normal range of motion. No tenderness.  No cyanosis, clubbing, or edema.  2+ distal pulses. NEUROLOGIC: Alert and oriented to person, place, and time. Normal reflexes, muscle tone coordination.  PSYCHIATRIC: Normal mood and affect. Normal behavior. Normal judgment and thought content. CARDIOVASCULAR: Normal heart rate noted, regular rhythm RESPIRATORY: Clear to auscultation bilaterally. Effort and breath sounds normal, no problems with respiration noted. BREASTS: Symmetric in size. No masses, tenderness, skin changes, nipple drainage, or lymphadenopathy bilaterally.  ABDOMEN: Soft, no distention noted.  No tenderness, rebound or guarding.  PELVIC: Normal appearing external genitalia and urethral meatus; normal appearing vaginal mucosa and cervix.  Normal  atrophy. No abnormal discharge noted.  Pap smear not due.  Normal uterine size, no other palpable masses, no uterine or adnexal tenderness.  .   Assessment and Plan:  Annual Well Women GYN exam  . Pap: not due  Mammogram :ordered  Labs: none Refills: Prometrium  Pt state she is taking a estrogen topical cream that she gets from compounded pharmacy on line. She is not sure of dose. She applies it to her wrist.  Referral: none  Routine preventative health maintenance measures emphasized. Please refer to After Visit Summary for other counseling recommendations.      Doreene Burke, CNM [facility]  [facility],  [facility]

## 2023-08-29 NOTE — Patient Instructions (Signed)
 Preventive Care 16-59 Years Old, Female  Preventive care refers to lifestyle choices and visits with your health care provider that can promote health and wellness. Preventive care visits are also called wellness exams.  What can I expect for my preventive care visit?  Counseling  Your health care provider may ask you questions about your:  Medical history, including:  Past medical problems.  Family medical history.  Pregnancy history.  Current health, including:  Menstrual cycle.  Method of birth control.  Emotional well-being.  Home life and relationship well-being.  Sexual activity and sexual health.  Lifestyle, including:  Alcohol, nicotine or tobacco, and drug use.  Access to firearms.  Diet, exercise, and sleep habits.  Work and work Astronomer.  Sunscreen use.  Safety issues such as seatbelt and bike helmet use.  Physical exam  Your health care provider will check your:  Height and weight. These may be used to calculate your BMI (body mass index). BMI is a measurement that tells if you are at a healthy weight.  Waist circumference. This measures the distance around your waistline. This measurement also tells if you are at a healthy weight and may help predict your risk of certain diseases, such as type 2 diabetes and high blood pressure.  Heart rate and blood pressure.  Body temperature.  Skin for abnormal spots.  What immunizations do I need?    Vaccines are usually given at various ages, according to a schedule. Your health care provider will recommend vaccines for you based on your age, medical history, and lifestyle or other factors, such as travel or where you work.  What tests do I need?  Screening  Your health care provider may recommend screening tests for certain conditions. This may include:  Lipid and cholesterol levels.  Diabetes screening. This is done by checking your blood sugar (glucose) after you have not eaten for a while (fasting).  Pelvic exam and Pap test.  Hepatitis B test.  Hepatitis C  test.  HIV (human immunodeficiency virus) test.  STI (sexually transmitted infection) testing, if you are at risk.  Lung cancer screening.  Colorectal cancer screening.  Mammogram. Talk with your health care provider about when you should start having regular mammograms. This may depend on whether you have a family history of breast cancer.  BRCA-related cancer screening. This may be done if you have a family history of breast, ovarian, tubal, or peritoneal cancers.  Bone density scan. This is done to screen for osteoporosis.  Talk with your health care provider about your test results, treatment options, and if necessary, the need for more tests.  Follow these instructions at home:  Eating and drinking    Eat a diet that includes fresh fruits and vegetables, whole grains, lean protein, and low-fat dairy products.  Take vitamin and mineral supplements as recommended by your health care provider.  Do not drink alcohol if:  Your health care provider tells you not to drink.  You are pregnant, may be pregnant, or are planning to become pregnant.  If you drink alcohol:  Limit how much you have to 0-1 drink a day.  Know how much alcohol is in your drink. In the [place] one drink equals one 12 oz bottle of beer (355 mL), one 5 oz glass of wine (148 mL), or one 1 oz glass of hard liquor (44 mL).  Lifestyle  Brush your teeth every morning and night with fluoride toothpaste. Floss one time each day.  Exercise for at least  30 minutes 5 or more days each week.  Do not use any products that contain nicotine or tobacco. These products include cigarettes, chewing tobacco, and vaping devices, such as e-cigarettes. If you need help quitting, ask your health care provider.  Do not use drugs.  If you are sexually active, practice safe sex. Use a condom or other form of protection to prevent STIs.  If you do not wish to become pregnant, use a form of birth control. If you plan to become pregnant, see your health care provider for a  prepregnancy visit.  Take aspirin only as told by your health care provider. Make sure that you understand how much to take and what form to take. Work with your health care provider to find out whether it is safe and beneficial for you to take aspirin daily.  Find healthy ways to manage stress, such as:  Meditation, yoga, or listening to music.  Journaling.  Talking to a trusted person.  Spending time with friends and family.  Minimize exposure to UV radiation to reduce your risk of skin cancer.  Safety  Always wear your seat belt while driving or riding in a vehicle.  Do not drive:  If you have been drinking alcohol. Do not ride with someone who has been drinking.  When you are tired or distracted.  While texting.  If you have been using any mind-altering substances or drugs.  Wear a helmet and other protective equipment during sports activities.  If you have firearms in your house, make sure you follow all gun safety procedures.  Seek help if you have been physically or sexually abused.  What's next?  Visit your health care provider once a year for an annual wellness visit.  Ask your health care provider how often you should have your eyes and teeth checked.  Stay up to date on all vaccines.  This information is not intended to replace advice given to you by your health care provider. Make sure you discuss any questions you have with your health care provider.  Document Revised: 10/27/2020 Document Reviewed: 10/27/2020  Elsevier Patient Education  2024 [employer].

## 2023-09-05 ENCOUNTER — Telehealth: Payer: Self-pay

## 2023-09-05 ENCOUNTER — Other Ambulatory Visit: Payer: Self-pay | Admitting: Certified Nurse Midwife

## 2023-09-05 ENCOUNTER — Other Ambulatory Visit: Payer: Self-pay

## 2023-09-05 MED ORDER — PROGESTERONE MICRONIZED 100 MG PO CAPS
100.0000 mg | ORAL_CAPSULE | Freq: Every day | ORAL | 3 refills | Status: AC
  Filled 2023-09-05: qty 90, 90d supply, fill #0
  Filled 2023-12-06: qty 90, 90d supply, fill #1
  Filled 2024-02-18: qty 90, 90d supply, fill #2
  Filled 2024-06-04: qty 90, 90d supply, fill #3

## 2023-09-05 NOTE — Telephone Encounter (Signed)
 Patient aware.

## 2023-09-05 NOTE — Telephone Encounter (Signed)
 Patient calling to clarify rx for progesterone  (PROMETRIUM ) 200 MG capsule . She advised she usually gets a 90 day supply and takes it daily. Her bottle reflect the rx that was sent. Advised Annie is on call today. Will send for her to review.   Disp Refills Start End    progesterone  (PROMETRIUM ) 200 MG capsule 36 capsule 4 08/29/2023 --   Sig - Route: Take 1 capsule (200 mg total) by mouth daily. 200 mg once daily for 12 days each month - Oral   Sent to pharmacy as: progesterone  (PROMETRIUM ) 200 MG capsule

## 2023-10-09 DIAGNOSIS — Z131 Encounter for screening for diabetes mellitus: Secondary | ICD-10-CM | POA: Diagnosis not present

## 2023-10-09 DIAGNOSIS — E785 Hyperlipidemia, unspecified: Secondary | ICD-10-CM | POA: Diagnosis not present

## 2023-10-09 DIAGNOSIS — I10 Essential (primary) hypertension: Secondary | ICD-10-CM | POA: Diagnosis not present

## 2023-10-16 DIAGNOSIS — D638 Anemia in other chronic diseases classified elsewhere: Secondary | ICD-10-CM | POA: Diagnosis not present

## 2023-10-16 DIAGNOSIS — M51362 Other intervertebral disc degeneration, lumbar region with discogenic back pain and lower extremity pain: Secondary | ICD-10-CM | POA: Diagnosis not present

## 2023-10-16 DIAGNOSIS — I10 Essential (primary) hypertension: Secondary | ICD-10-CM | POA: Diagnosis not present

## 2023-10-16 DIAGNOSIS — I471 Supraventricular tachycardia, unspecified: Secondary | ICD-10-CM | POA: Diagnosis not present

## 2023-10-16 DIAGNOSIS — M329 Systemic lupus erythematosus, unspecified: Secondary | ICD-10-CM | POA: Diagnosis not present

## 2023-10-16 DIAGNOSIS — E538 Deficiency of other specified B group vitamins: Secondary | ICD-10-CM | POA: Diagnosis not present

## 2023-10-16 DIAGNOSIS — Z1331 Encounter for screening for depression: Secondary | ICD-10-CM | POA: Diagnosis not present

## 2023-10-16 DIAGNOSIS — D863 Sarcoidosis of skin: Secondary | ICD-10-CM | POA: Diagnosis not present

## 2023-10-16 DIAGNOSIS — Z0000 Encounter for general adult medical examination without abnormal findings: Secondary | ICD-10-CM | POA: Diagnosis not present

## 2023-10-16 DIAGNOSIS — M3500 Sicca syndrome, unspecified: Secondary | ICD-10-CM | POA: Diagnosis not present

## 2023-10-25 DIAGNOSIS — Z1211 Encounter for screening for malignant neoplasm of colon: Secondary | ICD-10-CM | POA: Diagnosis not present

## 2023-10-30 LAB — COLOGUARD: COLOGUARD: POSITIVE — AB

## 2023-11-05 ENCOUNTER — Other Ambulatory Visit: Payer: Self-pay

## 2023-11-05 MED ORDER — METOPROLOL TARTRATE 25 MG PO TABS
12.5000 mg | ORAL_TABLET | Freq: Two times a day (BID) | ORAL | 1 refills | Status: AC
  Filled 2023-11-05: qty 90, 90d supply, fill #0
  Filled 2024-01-16: qty 90, 90d supply, fill #1

## 2023-11-05 MED ORDER — HYDROXYCHLOROQUINE SULFATE 200 MG PO TABS
200.0000 mg | ORAL_TABLET | Freq: Every day | ORAL | 1 refills | Status: DC
  Filled 2023-11-05: qty 90, 90d supply, fill #0
  Filled 2024-01-16: qty 90, 90d supply, fill #1

## 2023-12-06 ENCOUNTER — Other Ambulatory Visit: Payer: Self-pay

## 2023-12-06 MED ORDER — CYCLOSPORINE 0.05 % OP EMUL
1.0000 [drp] | Freq: Two times a day (BID) | OPHTHALMIC | 3 refills | Status: AC
  Filled 2023-12-06: qty 180, 90d supply, fill #0
  Filled 2024-06-04: qty 180, 90d supply, fill #1

## 2023-12-07 ENCOUNTER — Other Ambulatory Visit: Payer: Self-pay

## 2023-12-07 MED ORDER — LINZESS 145 MCG PO CAPS
145.0000 ug | ORAL_CAPSULE | Freq: Every day | ORAL | 3 refills | Status: AC
  Filled 2023-12-07: qty 90, 90d supply, fill #0
  Filled 2024-01-16 – 2024-02-18 (×3): qty 90, 90d supply, fill #1
  Filled 2024-06-04: qty 90, 90d supply, fill #2

## 2024-01-16 ENCOUNTER — Other Ambulatory Visit: Payer: Self-pay

## 2024-01-16 MED ORDER — HYDROXYZINE HCL 10 MG PO TABS
10.0000 mg | ORAL_TABLET | Freq: Three times a day (TID) | ORAL | 2 refills | Status: AC | PRN
  Filled 2024-01-16: qty 30, 10d supply, fill #0

## 2024-01-16 MED ORDER — SCOPOLAMINE 1 MG/3DAYS TD PT72
1.0000 | MEDICATED_PATCH | TRANSDERMAL | 0 refills | Status: AC
  Filled 2024-01-16: qty 10, 20d supply, fill #0

## 2024-01-16 MED ORDER — BACLOFEN 20 MG PO TABS
20.0000 mg | ORAL_TABLET | Freq: Three times a day (TID) | ORAL | 1 refills | Status: AC | PRN
  Filled 2024-01-16: qty 360, 90d supply, fill #0

## 2024-01-17 ENCOUNTER — Other Ambulatory Visit: Payer: Self-pay

## 2024-02-11 ENCOUNTER — Other Ambulatory Visit: Payer: Self-pay

## 2024-02-11 DIAGNOSIS — Z79899 Other long term (current) drug therapy: Secondary | ICD-10-CM | POA: Diagnosis not present

## 2024-02-11 DIAGNOSIS — M545 Low back pain, unspecified: Secondary | ICD-10-CM | POA: Diagnosis not present

## 2024-02-11 DIAGNOSIS — M3500 Sicca syndrome, unspecified: Secondary | ICD-10-CM | POA: Diagnosis not present

## 2024-02-11 DIAGNOSIS — G8929 Other chronic pain: Secondary | ICD-10-CM | POA: Diagnosis not present

## 2024-02-11 MED ORDER — PREDNISONE 5 MG PO TABS
ORAL_TABLET | ORAL | 0 refills | Status: AC
  Filled 2024-02-11: qty 30, 12d supply, fill #0

## 2024-02-11 MED ORDER — AMOXICILLIN-POT CLAVULANATE ER 1000-62.5 MG PO TB12
ORAL_TABLET | ORAL | 0 refills | Status: DC
  Filled 2024-02-11: qty 28, 7d supply, fill #0

## 2024-02-11 MED ORDER — HYDROXYCHLOROQUINE SULFATE 200 MG PO TABS
200.0000 mg | ORAL_TABLET | Freq: Every day | ORAL | 1 refills | Status: AC
  Filled 2024-02-11 – 2024-05-01 (×2): qty 90, 90d supply, fill #0

## 2024-02-14 ENCOUNTER — Other Ambulatory Visit: Payer: Self-pay

## 2024-02-18 ENCOUNTER — Other Ambulatory Visit: Payer: Self-pay

## 2024-03-27 ENCOUNTER — Other Ambulatory Visit: Payer: Self-pay

## 2024-03-27 DIAGNOSIS — R195 Other fecal abnormalities: Secondary | ICD-10-CM | POA: Diagnosis not present

## 2024-03-27 DIAGNOSIS — Z83719 Family history of colon polyps, unspecified: Secondary | ICD-10-CM | POA: Diagnosis not present

## 2024-03-27 MED ORDER — NA SULFATE-K SULFATE-MG SULF 17.5-3.13-1.6 GM/177ML PO SOLN
1.0000 | ORAL | 0 refills | Status: AC
Start: 2024-03-27 — End: ?
  Filled 2024-03-27: qty 354, 1d supply, fill #0

## 2024-04-08 DIAGNOSIS — I10 Essential (primary) hypertension: Secondary | ICD-10-CM | POA: Diagnosis not present

## 2024-04-08 DIAGNOSIS — E785 Hyperlipidemia, unspecified: Secondary | ICD-10-CM | POA: Diagnosis not present

## 2024-04-16 DIAGNOSIS — E538 Deficiency of other specified B group vitamins: Secondary | ICD-10-CM | POA: Diagnosis not present

## 2024-04-16 DIAGNOSIS — D638 Anemia in other chronic diseases classified elsewhere: Secondary | ICD-10-CM | POA: Diagnosis not present

## 2024-04-16 DIAGNOSIS — K5901 Slow transit constipation: Secondary | ICD-10-CM | POA: Diagnosis not present

## 2024-04-16 DIAGNOSIS — M329 Systemic lupus erythematosus, unspecified: Secondary | ICD-10-CM | POA: Diagnosis not present

## 2024-04-16 DIAGNOSIS — Z131 Encounter for screening for diabetes mellitus: Secondary | ICD-10-CM | POA: Diagnosis not present

## 2024-04-16 DIAGNOSIS — I471 Supraventricular tachycardia, unspecified: Secondary | ICD-10-CM | POA: Diagnosis not present

## 2024-04-16 DIAGNOSIS — I10 Essential (primary) hypertension: Secondary | ICD-10-CM | POA: Diagnosis not present

## 2024-04-16 DIAGNOSIS — M3500 Sicca syndrome, unspecified: Secondary | ICD-10-CM | POA: Diagnosis not present

## 2024-04-16 DIAGNOSIS — D863 Sarcoidosis of skin: Secondary | ICD-10-CM | POA: Diagnosis not present

## 2024-04-16 DIAGNOSIS — M5416 Radiculopathy, lumbar region: Secondary | ICD-10-CM | POA: Diagnosis not present

## 2024-04-23 ENCOUNTER — Encounter: Payer: Self-pay | Admitting: Gastroenterology

## 2024-04-23 ENCOUNTER — Encounter: Admission: RE | Disposition: A | Payer: Self-pay | Source: Home / Self Care | Attending: Gastroenterology

## 2024-04-23 ENCOUNTER — Other Ambulatory Visit: Payer: Self-pay | Admitting: Internal Medicine

## 2024-04-23 ENCOUNTER — Ambulatory Visit: Admitting: Anesthesiology

## 2024-04-23 ENCOUNTER — Ambulatory Visit
Admission: RE | Admit: 2024-04-23 | Discharge: 2024-04-23 | Disposition: A | Attending: Gastroenterology | Admitting: Gastroenterology

## 2024-04-23 DIAGNOSIS — R222 Localized swelling, mass and lump, trunk: Secondary | ICD-10-CM

## 2024-04-23 DIAGNOSIS — D120 Benign neoplasm of cecum: Secondary | ICD-10-CM | POA: Diagnosis not present

## 2024-04-23 DIAGNOSIS — Z87891 Personal history of nicotine dependence: Secondary | ICD-10-CM | POA: Diagnosis not present

## 2024-04-23 DIAGNOSIS — Z83719 Family history of colon polyps, unspecified: Secondary | ICD-10-CM | POA: Diagnosis not present

## 2024-04-23 DIAGNOSIS — I471 Supraventricular tachycardia, unspecified: Secondary | ICD-10-CM | POA: Diagnosis not present

## 2024-04-23 DIAGNOSIS — Z800 Family history of malignant neoplasm of digestive organs: Secondary | ICD-10-CM | POA: Diagnosis not present

## 2024-04-23 DIAGNOSIS — R591 Generalized enlarged lymph nodes: Secondary | ICD-10-CM

## 2024-04-23 DIAGNOSIS — K635 Polyp of colon: Secondary | ICD-10-CM | POA: Diagnosis not present

## 2024-04-23 DIAGNOSIS — Z1211 Encounter for screening for malignant neoplasm of colon: Secondary | ICD-10-CM | POA: Diagnosis not present

## 2024-04-23 DIAGNOSIS — I10 Essential (primary) hypertension: Secondary | ICD-10-CM | POA: Diagnosis not present

## 2024-04-23 DIAGNOSIS — Z79899 Other long term (current) drug therapy: Secondary | ICD-10-CM | POA: Diagnosis not present

## 2024-04-23 HISTORY — PX: COLONOSCOPY: SHX5424

## 2024-04-23 HISTORY — PX: SUBMUCOSAL INJECTION: SHX5543

## 2024-04-23 HISTORY — PX: POLYPECTOMY: SHX149

## 2024-04-23 HISTORY — PX: HEMOSTASIS CLIP PLACEMENT: SHX6857

## 2024-04-23 SURGERY — COLONOSCOPY
Anesthesia: General

## 2024-04-23 MED ORDER — PROPOFOL 500 MG/50ML IV EMUL
INTRAVENOUS | Status: DC | PRN
  Administered 2024-04-23: 50 mg via INTRAVENOUS
  Administered 2024-04-23: 125 ug/kg/min via INTRAVENOUS
  Administered 2024-04-23: 50 mg via INTRAVENOUS

## 2024-04-23 MED ORDER — SODIUM CHLORIDE 0.9 % IV SOLN
INTRAVENOUS | Status: DC
  Administered 2024-04-23: 500 mL via INTRAVENOUS

## 2024-04-23 NOTE — Op Note (Signed)
 [facility] Patient Name: Natalie Wheeler Procedure Date: 04/23/2024 8:22 AM MRN: [id number] Account #: [id number] Date of Birth: [birth year] Admit Type: Outpatient Age: 58 Room: [facility] ENDO ROOM 4 Gender: Female Note Status: Finalized Instrument Name: Peds Colonoscope [id number] Procedure:             Colonoscopy Indications:           Screening for colon cancer: Family history of                         colorectal cancer in multiple 2nd degree relatives,                         Last colonoscopy: March 2019, positive cologuard,                         family h/o colon polyps Providers:             Corinn Jess Brooklyn MD, MD Referring MD:          Ophelia Sage, MD (Referring MD) Medicines:             General Anesthesia Complications:         No immediate complications. Estimated blood loss: None. Procedure:             Pre-Anesthesia Assessment:                        - Prior to the procedure, a History and Physical was                         performed, and patient medications and allergies were                         reviewed. The patient is competent. The risks and                         benefits of the procedure and the sedation options and                         risks were discussed with the patient. All questions                         were answered and informed consent was obtained.                         Patient identification and proposed procedure were                         verified by the physician, the nurse, the                         anesthesiologist, the anesthetist and the technician                         in the pre-procedure area in the procedure room in the                         endoscopy suite. Mental Status Examination: alert and  oriented. Airway Examination: normal oropharyngeal                         airway and neck mobility. Respiratory Examination:                         clear to auscultation. CV  Examination: normal.                         Prophylactic Antibiotics: The patient does not require                         prophylactic antibiotics. Prior Anticoagulants: The                         patient has taken no anticoagulant or antiplatelet                         agents. ASA Grade Assessment: II - A patient with mild                         systemic disease. After reviewing the risks and                         benefits, the patient was deemed in satisfactory                         condition to undergo the procedure. The anesthesia                         plan was to use general anesthesia. Immediately prior                         to administration of medications, the patient was                         re-assessed for adequacy to receive sedatives. The                         heart rate, respiratory rate, oxygen saturations,                         blood pressure, adequacy of pulmonary ventilation, and                         response to care were monitored throughout the                         procedure. The physical status of the patient was                         re-assessed after the procedure.                        After obtaining informed consent, the colonoscope was                         passed under direct vision. Throughout the procedure,  the patient's blood pressure, pulse, and oxygen                         saturations were monitored continuously. The                         Colonoscope was introduced through the anus and                         advanced to the the cecum, identified by appendiceal                         orifice and ileocecal valve. The colonoscopy was                         performed with moderate difficulty due to a tortuous                         colon. Successful completion of the procedure was                         aided by applying abdominal pressure. The patient                         tolerated the procedure  well. The quality of the bowel                         preparation was evaluated using the BBPS [facility] Bowel                         Preparation Scale) with scores of: Right Colon = 3,                         Transverse Colon = 3 and Left Colon = 3 (entire mucosa                         seen well with no residual staining, small fragments                         of stool or opaque liquid). The total BBPS score                         equals 9. The ileocecal valve, appendiceal orifice,                         and rectum were photographed. Findings:      The perianal and digital rectal examinations were normal. Pertinent       negatives include normal sphincter tone and no palpable rectal lesions.      A 12 mm polyp was found in the cecum. The polyp was flat. Preparations       were made for mucosal resection. Demarcation of the lesion was performed       with narrow band imaging to clearly identify the boundaries of the       lesion. Eleview was injected to raise the lesion. Snare mucosal       resection was performed. Resection and retrieval were complete. Resected  tissue including tissue margins will be examined by histology. To       prevent bleeding after mucosal resection, two hemostatic clips were       successfully placed (MR safe). Clip manufacturer: [employer].       There was no bleeding during, or at the end, of the procedure. Estimated       blood loss: none.      The exam was otherwise without abnormality on direct and retroflexion       views. Impression:            - One 12 mm polyp in the cecum, removed with mucosal                         resection. Resected and retrieved. Clip manufacturer:                         [employer]. Clips (MR safe) were placed.                        - The examination was otherwise normal on direct and                         retroflexion views.                        - Mucosal resection was performed. Resection and                          retrieval were complete. Recommendation:        - Discharge patient to home (with escort).                        - Resume previous diet today.                        - Continue present medications.                        - Await pathology results.                        - Repeat colonoscopy in 3 - 5 years for surveillance. Procedure Code(s):     --- Professional ---                        [phone], Colonoscopy, flexible; with endoscopic mucosal                         resection Diagnosis Code(s):     --- Professional ---                        Z80.0, Family history of malignant neoplasm of                         digestive organs                        D12.0, Benign neoplasm of cecum CPT copyright 2022 American Medical Association. All rights reserved. The codes documented in this report are preliminary and upon coder review may  be revised  to meet current compliance requirements. Dr. Corinn Brooklyn Corinn Jess Brooklyn MD, MD 04/23/2024 8:59:16 AM This report has been signed electronically. Number of Addenda: 0 Note Initiated On: 04/23/2024 8:22 AM Scope Withdrawal Time: 0 hours 17 minutes 59 seconds  Total Procedure Duration: 0 hours 24 minutes 20 seconds  Estimated Blood Loss:  Estimated blood loss: none.      [facility]

## 2024-04-23 NOTE — Anesthesia Postprocedure Evaluation (Signed)
 Anesthesia Post Note  Patient: Jenalee R Gebert  Procedure(s) Performed: COLONOSCOPY POLYPECTOMY, INTESTINE CONTROL OF HEMORRHAGE, GI TRACT, ENDOSCOPIC, BY CLIPPING OR OVERSEWING  Patient location during evaluation: Endoscopy Anesthesia Type: General Level of consciousness: awake and alert Pain management: pain level controlled Vital Signs Assessment: post-procedure vital signs reviewed and stable Respiratory status: spontaneous breathing, nonlabored ventilation and respiratory function stable Cardiovascular status: blood pressure returned to baseline and stable Postop Assessment: no apparent nausea or vomiting Anesthetic complications: no   There were no known notable events for this encounter.   Last Vitals:  Vitals:   04/23/24 0911 04/23/24 0919  BP: 121/61 132/65  Pulse: 65 65  Resp: 10 (!) 9  Temp:    SpO2: 100% 93%    Last Pain:  Vitals:   04/23/24 0919  TempSrc:   PainSc: 0-No pain                 Camellia Merilee Louder

## 2024-04-23 NOTE — Transfer of Care (Signed)
 Immediate Anesthesia Transfer of Care Note  Patient: Natalie Wheeler  Procedure(s) Performed: COLONOSCOPY POLYPECTOMY, INTESTINE CONTROL OF HEMORRHAGE, GI TRACT, ENDOSCOPIC, BY CLIPPING OR OVERSEWING  Patient Location: Endoscopy Unit  Anesthesia Type:General  Level of Consciousness: awake  Airway & Oxygen Therapy: Patient Spontanous Breathing  Post-op Assessment: Report given to RN and Post -op Vital signs reviewed and stable  Post vital signs: Reviewed and stable  Last Vitals:  Vitals Value Taken Time  BP    Temp    Pulse 76 04/23/24 09:00  Resp    SpO2 100 % 04/23/24 09:00  Vitals shown include unfiled device data.  Last Pain:  Vitals:   04/23/24 0801  TempSrc: Temporal  PainSc: 0-No pain         Complications: There were no known notable events for this encounter.

## 2024-04-23 NOTE — H&P (Signed)
 Natalie JONELLE Brooklyn, MD [facility], [address]  [place], [place] [address]  Main: [phone] Fax:  [phone] Pager: [phone]   Primary Care Physician:  Fernande Ophelia JINNY DOUGLAS, MD Primary Gastroenterologist:  Dr. Corinn JONELLE Wheeler  Pre-Procedure History & Physical: HPI:  Natalie Wheeler is a 58 y.o. female is here for an colonoscopy.   Past Medical History:  Diagnosis Date   Anemia    Cervicitis    Decreased libido    Degenerative disc disease, lumbar    L1-L5   Dyspareunia    Hypertension    Keratosis pilaris    Lupus    Lupus    Motion sickness    ships, back seat cars   Raynaud's disease    Raynaud's syndrome    Raynauds syndrome    Sciatica    right side   Sjogren's disease    SVT (supraventricular tachycardia)    controlled with metoprolol    URI (upper respiratory infection)    Head cold, started last week. Antibiotic and prenisone taper rx'd on 02/12/17. Improved.   Vaginal Pap smear, abnormal     Past Surgical History:  Procedure Laterality Date   BAND HEMORRHOIDECTOMY     COLONOSCOPY WITH PROPOFOL  N/A 08/03/2017   Procedure: COLONOSCOPY WITH PROPOFOL ;  Surgeon: Viktoria Lamar DASEN, MD;  Location: [facility] ENDOSCOPY;  Service: Endoscopy;  Laterality: N/A;   LEEP     NASAL SEPTOPLASTY W/ TURBINOPLASTY Bilateral 02/21/2017   Procedure: NASAL SEPTOPLASTY WITH  INFERIOR TURBINATE REDUCTION;  Surgeon: Milissa Hamming, MD;  Location: [facility];  Service: ENT;  Laterality: Bilateral;   PRK Bilateral    TONSILLECTOMY      Prior to Admission medications   Medication Sig Start Date End Date Taking? Authorizing Provider  progesterone  (PROMETRIUM ) 100 MG capsule Take 1 capsule (100 mg total) by mouth daily. 09/05/23   Sebastian Sham, CNM  amoxicillin -clavulanate (AUGMENTIN  XR) 1000-62.5 MG 12 hr tablet Take 2 tablets (2,000 mg total) by mouth every 12 (twelve) hours for 7 days 02/11/24     azelastine  (ASTELIN ) 0.1 % nasal spray  Place 1 spray into both nostrils 2 (two) times daily as needed for runny nose. 01/24/23     baclofen  (LIORESAL ) 20 MG tablet Take 1 tablet (20 mg total) by mouth 3 (three) times daily as needed and 1 tablet at night 01/04/23     baclofen  (LIORESAL ) 20 MG tablet Take 1 tablet (20 mg total) by mouth 3 (three) times daily as needed and 1 tablet at night. 01/16/24     calcium-vitamin D  (OSCAL WITH D) 250-125 MG-UNIT tablet Take 1 tablet by mouth daily.    [provider]  cyanocobalamin  (VITAMIN B12) 1000 MCG/ML injection Inject into the muscle. 01/30/22   [provider]  cycloSPORINE  (RESTASIS ) 0.05 % ophthalmic emulsion Place 1 drop into both eyes 2 (two) times daily. 12/06/23     hydroxychloroquine  (PLAQUENIL ) 200 MG tablet Take 1 tablet (200 mg total) by mouth daily. 02/11/24     hydrOXYzine  (ATARAX ) 10 MG tablet Take 1 tablet (10 mg total) by mouth 3 (three) times daily as needed for anxiety. 01/16/24     linaclotide  (LINZESS ) 145 MCG CAPS capsule Take 1 capsule (145 mcg total) by mouth daily. 12/07/23     loratadine (CLARITIN) 10 MG tablet Take 10 mg by mouth daily.    [provider]  magnesium 30 MG tablet Take 30 mg by mouth 2 (two) times daily.  [provider]  metoprolol  tartrate (LOPRESSOR ) 25 MG tablet Take 0.5 tablets (12.5 mg total) by mouth 2 (two) times daily. 11/05/23     Multiple Vitamin (MULTIVITAMIN) tablet Take 1 tablet by mouth daily.    [provider]  Na Sulfate-K Sulfate-Mg Sulfate concentrate (SUPREP) 17.5-3.13-1.6 GM/177ML SOLN Take 2 Bottles (1 kit total) by mouth as directed One kit contains 2 bottles.  Take both bottles at the times instructed by your provider. 03/27/24     Omega-3 Fatty Acids (FISH OIL) 1000 MG CAPS Take by mouth.    [provider]  predniSONE  (DELTASONE ) 5 MG tablet Take 4 tablets (20 mg total) by mouth once daily for 3 days, THEN 3 tablets (15 mg total) once daily for 3 days, THEN 2 tablets (10 mg total)  once daily for 3 days, THEN 1 tablet (5 mg total) once daily for 3 days. Take in the mornings. 02/11/24     scopolamine  (TRANSDERM-SCOP) 1 MG/3DAYS Place 1 patch (1 mg total) onto the skin every third day. Apply patch to hairless area behind the ear at least 4 hours prior to exposure and every 3 days as needed. Best if applied 12 hrs prior to exposure 01/16/24       Allergies as of 03/31/2024 - Review Complete 08/29/2023  Allergen Reaction Noted   Floxin [ofloxacin] Shortness Of Breath 02/10/2015   Buspirone Nausea Only 02/14/2017   Ciprofloxacin  02/14/2017   Doxycycline   02/10/2015   Mobic [meloxicam]  02/10/2015   Nifedipine  02/14/2017   Sulfadiazine  02/10/2015   Tramadol Nausea Only 02/10/2015   Zofran  [ondansetron  hcl]  03/16/2017   Sulfa antibiotics Rash 02/14/2017    Family History  Problem Relation Age of Onset   Breast cancer Paternal Aunt 43   Diabetes Maternal Grandmother    Heart disease Maternal Grandfather    Heart disease Paternal Grandmother    Cancer Maternal Aunt        ovarian   Cirrhosis Father     Social History   Socioeconomic History   Marital status: Single    Spouse name: Not on file   Number of children: Not on file   Years of education: Not on file   Highest education level: Not on file  Occupational History   Not on file  Tobacco Use   Smoking status: Former    Current packs/day: 0.00    Types: Cigarettes    Quit date: 1993    Years since quitting: 32.9   Smokeless tobacco: Never  Vaping Use   Vaping status: Never Used  Substance and Sexual Activity   Alcohol use: Yes    Alcohol/week: 3.0 standard drinks of alcohol    Types: 3 Glasses of wine per week    Comment: none last 24hrs   Drug use: No   Sexual activity: Not Currently    Birth control/protection: None  Other Topics Concern   Not on file  Social History Narrative   Not on file   Social Drivers of Health   Financial Resource Strain: Low Risk  (10/14/2023)   Received from  [facility]   Overall Financial Resource Strain (CARDIA)    Difficulty of Paying Living Expenses: Not hard at all  Food Insecurity: No Food Insecurity (10/14/2023)   Received from [facility]   Hunger Vital Sign    Within the past 12 months, you worried that your food would run out before you got the money to buy more.:  Never true    Within the past 12 months, the food you bought just didn't last and you didn't have money to get more.: Never true  Transportation Needs: No Transportation Needs (10/14/2023)   Received from [facility] - Transportation    In the past 12 months, has lack of transportation kept you from medical appointments or from getting medications?: No    Lack of Transportation (Non-Medical): No  Physical Activity: Not on file  Stress: Not on file  Social Connections: Not on file  Intimate Partner Violence: Not on file    Review of Systems: See HPI, otherwise negative ROS  Physical Exam: BP 135/66   Pulse 63   Temp (!) 96 F (35.6 C) (Temporal)   Resp 20   Ht 5' 5 (1.651 m)   Wt 62.9 kg   LMP  (LMP Unknown)   SpO2 100%   BMI 23.06 kg/m  General:   Alert,  pleasant and cooperative in NAD Head:  Normocephalic and atraumatic. Neck:  Supple; no masses or thyromegaly. Lungs:  Clear throughout to auscultation.    Heart:  Regular rate and rhythm. Abdomen:  Soft, nontender and nondistended. Normal bowel sounds, without guarding, and without rebound.   Neurologic:  Alert and  oriented x4;  grossly normal neurologically.  Impression/Plan: CALIOPE RUPPERT is here for an colonoscopy to be performed for positive cologuard  Risks, benefits, limitations, and alternatives regarding  colonoscopy have been reviewed with the patient.  Questions have been answered.  All parties agreeable.   Natalie Brooklyn, MD  04/23/2024, 8:16 AM

## 2024-04-23 NOTE — Anesthesia Preprocedure Evaluation (Addendum)
 Anesthesia Evaluation  Patient identified by MRN, date of birth, ID band Patient awake    Reviewed: Allergy & Precautions, H&P , NPO status , Patient's Chart, lab work & pertinent test results  Airway Mallampati: II  TM Distance: >3 FB Neck ROM: full    Dental no notable dental hx.    Pulmonary former smoker   Pulmonary exam normal        Cardiovascular hypertension, Normal cardiovascular exam+ dysrhythmias (h/o SVT on metoprolol )      Neuro/Psych  Neuromuscular disease  negative psych ROS   GI/Hepatic negative GI ROS, Neg liver ROS,,,  Endo/Other  negative endocrine ROS    Renal/GU negative Renal ROS  negative genitourinary   Musculoskeletal   Abdominal Normal abdominal exam  (+)   Peds  Hematology negative hematology ROS (+)   Anesthesia Other Findings Past Medical History: SLE TMJ No date: Anemia No date: Cervicitis No date: Decreased libido No date: Degenerative disc disease, lumbar     Comment:  L1-L5 No date: Dyspareunia No date: Hypertension No date: Keratosis pilaris No date: Lupus No date: Lupus No date: Motion sickness     Comment:  ships, back seat cars No date: Raynaud's disease No date: Raynaud's syndrome No date: Raynauds syndrome No date: Sciatica     Comment:  right side No date: Sjogren's disease No date: SVT (supraventricular tachycardia)     Comment:  controlled with metoprolol  No date: URI (upper respiratory infection)     Comment:  Head cold, started last week. Antibiotic and prenisone               taper rx'd on 02/12/17. Improved. No date: Vaginal Pap smear, abnormal  Past Surgical History: No date: BAND HEMORRHOIDECTOMY 08/03/2017: COLONOSCOPY WITH PROPOFOL ; N/A     Comment:  Procedure: COLONOSCOPY WITH PROPOFOL ;  Surgeon: Viktoria Lamar DASEN, MD;  Location: [facility] ENDOSCOPY;  Service:               Endoscopy;  Laterality: N/A; No date: LEEP 02/21/2017: NASAL  SEPTOPLASTY W/ TURBINOPLASTY; Bilateral     Comment:  Procedure: NASAL SEPTOPLASTY WITH  INFERIOR TURBINATE               REDUCTION;  Surgeon: Milissa Hamming, MD;  Location:               [facility];  Service: ENT;  Laterality:               Bilateral; No date: PRK; Bilateral No date: TONSILLECTOMY     Reproductive/Obstetrics negative OB ROS                              Anesthesia Physical Anesthesia Plan  ASA: 2  Anesthesia Plan: General   Post-op Pain Management: Minimal or no pain anticipated   Induction: Intravenous  PONV Risk Score and Plan: Propofol  infusion and TIVA  Airway Management Planned: Natural Airway  Additional Equipment:   Intra-op Plan:   Post-operative Plan:   Informed Consent: I have reviewed the patients History and Physical, chart, labs and discussed the procedure including the risks, benefits and alternatives for the proposed anesthesia with the patient or authorized representative who has indicated his/her understanding and acceptance.     Dental Advisory Given  Plan Discussed with: CRNA and Surgeon  Anesthesia Plan Comments:  Anesthesia Quick Evaluation

## 2024-04-24 ENCOUNTER — Ambulatory Visit: Payer: Self-pay | Admitting: Gastroenterology

## 2024-04-24 LAB — SURGICAL PATHOLOGY

## 2024-05-02 ENCOUNTER — Ambulatory Visit
Admission: RE | Admit: 2024-05-02 | Discharge: 2024-05-02 | Disposition: A | Discharge status: Home / Self Care | Source: Ambulatory Visit | Attending: Internal Medicine | Admitting: Internal Medicine

## 2024-05-02 DIAGNOSIS — R222 Localized swelling, mass and lump, trunk: Secondary | ICD-10-CM | POA: Insufficient documentation

## 2024-05-02 DIAGNOSIS — R591 Generalized enlarged lymph nodes: Secondary | ICD-10-CM | POA: Insufficient documentation

## 2024-05-02 MED ORDER — IOHEXOL 300 MG/ML  SOLN
100.0000 mL | Freq: Once | INTRAMUSCULAR | Status: AC | PRN
  Administered 2024-05-02: 100 mL via INTRAVENOUS

## 2024-05-02 MED ORDER — METOPROLOL TARTRATE 25 MG PO TABS
12.5000 mg | ORAL_TABLET | Freq: Two times a day (BID) | ORAL | 0 refills | Status: AC
  Filled 2024-05-02: qty 90, 90d supply, fill #0

## 2024-05-13 ENCOUNTER — Other Ambulatory Visit: Payer: Self-pay

## 2024-05-28 ENCOUNTER — Other Ambulatory Visit: Payer: Self-pay

## 2024-05-28 MED ORDER — PREDNISONE 5 MG PO TABS
ORAL_TABLET | ORAL | 0 refills | Status: AC
  Filled 2024-05-28: qty 30, 12d supply, fill #0

## 2024-05-28 MED ORDER — AMOXICILLIN-POT CLAVULANATE ER 1000-62.5 MG PO TB12
ORAL_TABLET | ORAL | 0 refills | Status: AC
  Filled 2024-05-28: qty 28, 7d supply, fill #0

## 2024-06-04 ENCOUNTER — Other Ambulatory Visit: Payer: Self-pay

## 2024-06-05 ENCOUNTER — Other Ambulatory Visit: Payer: Self-pay

## 2024-06-05 MED ORDER — GABAPENTIN 100 MG PO CAPS
100.0000 mg | ORAL_CAPSULE | Freq: Every evening | ORAL | 1 refills | Status: AC
  Filled 2024-06-05: qty 60, 30d supply, fill #0
